# Patient Record
Sex: Female | Born: 1955
Health system: Southern US, Community
[De-identification: ages and names within clinical notes are randomized; demographics above are authoritative.]

## PROBLEM LIST (undated history)

## (undated) DIAGNOSIS — I639 Cerebral infarction, unspecified: Secondary | ICD-10-CM

## (undated) DIAGNOSIS — I10 Essential (primary) hypertension: Secondary | ICD-10-CM

## (undated) HISTORY — DX: Essential (primary) hypertension: I10

## (undated) HISTORY — DX: Cerebral infarction, unspecified: I63.9

---

## 2020-06-29 DIAGNOSIS — Z0189 Encounter for other specified special examinations: Secondary | ICD-10-CM | POA: Diagnosis not present

## 2020-07-02 DIAGNOSIS — I10 Essential (primary) hypertension: Secondary | ICD-10-CM | POA: Diagnosis not present

## 2020-07-02 DIAGNOSIS — Z043 Encounter for examination and observation following other accident: Secondary | ICD-10-CM | POA: Diagnosis not present

## 2020-07-02 DIAGNOSIS — Z713 Dietary counseling and surveillance: Secondary | ICD-10-CM | POA: Diagnosis not present

## 2020-07-09 ENCOUNTER — Other Ambulatory Visit: Payer: Self-pay

## 2020-07-09 ENCOUNTER — Ambulatory Visit
Admission: RE | Admit: 2020-07-09 | Discharge: 2020-07-09 | Disposition: A | Discharge status: Home / Self Care | Payer: BC Managed Care – PPO | Source: Ambulatory Visit | Attending: Sports Medicine | Admitting: Sports Medicine

## 2020-07-09 VITALS — BP 125/80 | HR 62 | Temp 98.0°F | Resp 18 | Ht 65.0 in | Wt 210.0 lb

## 2020-07-09 DIAGNOSIS — R350 Frequency of micturition: Secondary | ICD-10-CM | POA: Diagnosis not present

## 2020-07-09 DIAGNOSIS — N898 Other specified noninflammatory disorders of vagina: Secondary | ICD-10-CM | POA: Diagnosis not present

## 2020-07-09 LAB — URINALYSIS, COMPLETE (UACMP) WITH MICROSCOPIC
Bilirubin Urine: NEGATIVE
Glucose, UA: NEGATIVE mg/dL
Ketones, ur: NEGATIVE mg/dL
Nitrite: NEGATIVE
Protein, ur: NEGATIVE mg/dL
Specific Gravity, Urine: 1.025 (ref 1.005–1.030)
pH: 5.5 (ref 5.0–8.0)

## 2020-07-09 LAB — WET PREP, GENITAL
Clue Cells Wet Prep HPF POC: NONE SEEN
Sperm: NONE SEEN
Trich, Wet Prep: NONE SEEN
Yeast Wet Prep HPF POC: NONE SEEN

## 2020-07-09 MED ORDER — SULFAMETHOXAZOLE-TRIMETHOPRIM 800-160 MG PO TABS: 1.0000 | ORAL_TABLET | Freq: Two times a day (BID) | ORAL | 0 refills | Status: AC

## 2020-07-09 NOTE — ED Triage Notes (Signed)
Pt c/o urinary frequency and retention. She also states she has a discharge but it is clear and does not smell. She states she is unsure where the discharge is coming from

## 2020-07-09 NOTE — ED Provider Notes (Signed)
[facility]    CSN: [id number] Arrival date & time: 07/09/20  1839      History   Chief Complaint Chief Complaint  Patient presents with  . Appointment  . Urinary Frequency    HPI Karla Powell is a 64 y.o. female presenting for urinary frequency and urinary urgency over the past 10 days. Patient also states that she has had clear nonmalodorous vaginal discharge or "film" in her underwear.  She denies any dysuria, hematuria, abdominal pain/pelvic pain/back pain, vomiting, vaginal itching or lesions.  Denies any concern for STIs.  Has not tried any over-the-counter medications.  Denies any other complaints or concerns at this time.  HPI  History reviewed. No pertinent past medical history.  There are no problems to display for this patient.   Past Surgical History:  Procedure Laterality Date  . CESAREAN SECTION      OB History   No obstetric history on file.      Home Medications    Prior to Admission medications   Medication Sig Start Date End Date Taking? Authorizing Provider  amLODipine (NORVASC) 2.5 MG tablet Take 2.5 mg by mouth daily.   Yes [provider]  aspirin EC 81 MG tablet Take 81 mg by mouth daily. Swallow whole.   Yes [provider]  meloxicam (MOBIC) 15 MG tablet Take 15 mg by mouth daily.   Yes [provider]  sulfamethoxazole-trimethoprim (BACTRIM DS) 800-160 MG tablet Take 1 tablet by mouth 2 (two) times daily for 5 days. 07/09/20 07/14/20  Shirlee Latch, PA-C    Family History No family history on file.  Social History Social History   Tobacco Use  . Smoking status: Never Smoker  . Smokeless tobacco: Never Used  Vaping Use  . Vaping Use: Never used  Substance Use Topics  . Alcohol use: Not Currently  . Drug use: Not Currently     Allergies   Patient has no known allergies.   Review of Systems Review of Systems  Constitutional: Negative for chills and fever.  Gastrointestinal:  Negative for abdominal pain, diarrhea, nausea and vomiting.  Genitourinary: Positive for decreased urine volume, frequency, urgency and vaginal discharge. Negative for dysuria, flank pain, hematuria, pelvic pain, vaginal bleeding and vaginal pain.  Musculoskeletal: Negative for back pain.  Skin: Negative for rash.     Physical Exam Triage Vital Signs ED Triage Vitals  Enc Vitals Group     BP 07/09/20 1921 125/80     Pulse Rate 07/09/20 1921 62     Resp 07/09/20 1921 18     Temp 07/09/20 1921 98 F (36.7 C)     Temp Source 07/09/20 1921 Oral     SpO2 07/09/20 1921 100 %     Weight 07/09/20 1918 210 lb (95.3 kg)     Height 07/09/20 1918 5\' 5"  (1.651 m)     Head Circumference --      Peak Flow --      Pain Score 07/09/20 1918 0     Pain Loc --      Pain Edu? --      Excl. in GC? --    No data found.  Updated Vital Signs BP 125/80 (BP Location: Left Arm)   Pulse 62   Temp 98 F (36.7 C) (Oral)   Resp 18   Ht 5\' 5"  (1.651 m)   Wt 210 lb (95.3 kg)   SpO2 100%   BMI 34.95 kg/m  Physical Exam Vitals and nursing note reviewed.  Constitutional:      General: She is not in acute distress.    Appearance: Normal appearance. She is not ill-appearing or toxic-appearing.  HENT:     Head: Normocephalic and atraumatic.     Nose: Nose normal.     Mouth/Throat:     Mouth: Mucous membranes are moist.     Pharynx: Oropharynx is clear.  Eyes:     General: No scleral icterus.       Right eye: No discharge.        Left eye: No discharge.     Conjunctiva/sclera: Conjunctivae normal.  Cardiovascular:     Rate and Rhythm: Normal rate and regular rhythm.     Heart sounds: Normal heart sounds.  Pulmonary:     Effort: Pulmonary effort is normal. No respiratory distress.     Breath sounds: Normal breath sounds.  Abdominal:     Palpations: Abdomen is soft.     Tenderness: There is no abdominal tenderness. There is no right CVA tenderness or left CVA tenderness.   Musculoskeletal:     Cervical back: Neck supple.  Skin:    General: Skin is dry.  Neurological:     General: No focal deficit present.     Mental Status: She is alert. Mental status is at baseline.     Motor: No weakness.     Gait: Gait normal.  Psychiatric:        Mood and Affect: Mood normal.        Behavior: Behavior normal.        Thought Content: Thought content normal.      UC Treatments / Results  Labs (all labs ordered are listed, but only abnormal results are displayed) Labs Reviewed  WET PREP, GENITAL - Abnormal; Notable for the following components:      Result Value   WBC, Wet Prep HPF POC MANY (*)    All other components within normal limits  URINALYSIS, COMPLETE (UACMP) WITH MICROSCOPIC - Abnormal; Notable for the following components:   Hgb urine dipstick TRACE (*)    Leukocytes,Ua SMALL (*)    Bacteria, UA FEW (*)    All other components within normal limits    EKG   Radiology No results found.  Procedures Procedures (including critical care time)  Medications Ordered in UC Medications - No data to display  Initial Impression / Assessment and Plan / UC Course  I have reviewed the triage vital signs and the nursing notes.  Pertinent labs & imaging results that were available during my care of the patient were reviewed by me and considered in my medical decision making (see chart for details).   Wet prep is within normal limits.  Urinalysis shows trace blood and small leukocytes, however this does not appear to be a clean-catch.  We will send urine for culture.  Will treat for possible UTI given symptoms.  Advised increased rest and fluids.  Treating with short course of Bactrim DS.  Advised to follow-up as needed.  ED precautions reviewed.   Final Clinical Impressions(s) / UC Diagnoses   Final diagnoses:  Urinary frequency  Vaginal discharge     Discharge Instructions     The urine test does show some small white blood cells and trace  blood in the urine.  The urine was not a clean-catch, however.  I cannot say with certainty that you have a UTI.  I will send the urine for culture and  we will call with results in a couple of days.  Since you are having some increased symptoms over the past couple of days, we will treat you for UTI at this time with Bactrim DS.  If the urine culture is negative, someone may call you and advise you that you do not need to complete the antibiotic course.  If urine culture is negative and you do not have a UTI, you will need to follow-up with PCP for further work-up of your urinary frequency and urgency.  You may also consider following up with gynecology or urologist.    ED Prescriptions    Medication Sig Dispense Auth. Provider   sulfamethoxazole-trimethoprim (BACTRIM DS) 800-160 MG tablet Take 1 tablet by mouth 2 (two) times daily for 5 days. 10 tablet Gareth Morgan     PDMP not reviewed this encounter.   Shirlee Latch, PA-C 07/11/20 1235

## 2020-07-09 NOTE — Discharge Instructions (Signed)
The urine test does show some small white blood cells and trace blood in the urine.  The urine was not a clean-catch, however.  I cannot say with certainty that you have a UTI.  I will send the urine for culture and we will call with results in a couple of days.  Since you are having some increased symptoms over the past couple of days, we will treat you for UTI at this time with Bactrim DS.  If the urine culture is negative, someone may call you and advise you that you do not need to complete the antibiotic course.  If urine culture is negative and you do not have a UTI, you will need to follow-up with PCP for further work-up of your urinary frequency and urgency.  You may also consider following up with gynecology or urologist.

## 2020-07-26 DIAGNOSIS — M545 Low back pain, unspecified: Secondary | ICD-10-CM | POA: Diagnosis not present

## 2020-07-26 DIAGNOSIS — R300 Dysuria: Secondary | ICD-10-CM | POA: Diagnosis not present

## 2020-07-26 DIAGNOSIS — Z01419 Encounter for gynecological examination (general) (routine) without abnormal findings: Secondary | ICD-10-CM | POA: Diagnosis not present

## 2020-07-26 DIAGNOSIS — Z1231 Encounter for screening mammogram for malignant neoplasm of breast: Secondary | ICD-10-CM | POA: Diagnosis not present

## 2020-07-26 DIAGNOSIS — R319 Hematuria, unspecified: Secondary | ICD-10-CM | POA: Diagnosis not present

## 2020-07-26 LAB — HM MAMMOGRAPHY

## 2020-07-26 LAB — HM PAP SMEAR: HM Pap smear: NORMAL

## 2020-08-13 DIAGNOSIS — R311 Benign essential microscopic hematuria: Secondary | ICD-10-CM | POA: Diagnosis not present

## 2020-08-13 DIAGNOSIS — N390 Urinary tract infection, site not specified: Secondary | ICD-10-CM | POA: Diagnosis not present

## 2020-08-13 DIAGNOSIS — R3129 Other microscopic hematuria: Secondary | ICD-10-CM | POA: Diagnosis not present

## 2020-08-13 DIAGNOSIS — R309 Painful micturition, unspecified: Secondary | ICD-10-CM | POA: Diagnosis not present

## 2020-08-27 DIAGNOSIS — H2513 Age-related nuclear cataract, bilateral: Secondary | ICD-10-CM | POA: Diagnosis not present

## 2020-08-27 DIAGNOSIS — H16223 Keratoconjunctivitis sicca, not specified as Sjogren's, bilateral: Secondary | ICD-10-CM | POA: Diagnosis not present

## 2020-08-30 ENCOUNTER — Encounter: Payer: Self-pay | Admitting: Family Medicine

## 2020-08-30 ENCOUNTER — Ambulatory Visit (INDEPENDENT_AMBULATORY_CARE_PROVIDER_SITE_OTHER): Payer: BC Managed Care – PPO | Admitting: Family Medicine

## 2020-08-30 ENCOUNTER — Other Ambulatory Visit: Payer: Self-pay | Admitting: Family Medicine

## 2020-08-30 ENCOUNTER — Other Ambulatory Visit: Payer: Self-pay

## 2020-08-30 VITALS — BP 102/62 | HR 76 | Ht 65.0 in | Wt 216.0 lb

## 2020-08-30 DIAGNOSIS — Z7689 Persons encountering health services in other specified circumstances: Secondary | ICD-10-CM

## 2020-08-30 DIAGNOSIS — R350 Frequency of micturition: Secondary | ICD-10-CM | POA: Diagnosis not present

## 2020-08-30 DIAGNOSIS — R030 Elevated blood-pressure reading, without diagnosis of hypertension: Secondary | ICD-10-CM | POA: Diagnosis not present

## 2020-08-30 DIAGNOSIS — B3731 Acute candidiasis of vulva and vagina: Secondary | ICD-10-CM

## 2020-08-30 DIAGNOSIS — N952 Postmenopausal atrophic vaginitis: Secondary | ICD-10-CM

## 2020-08-30 DIAGNOSIS — B373 Candidiasis of vulva and vagina: Secondary | ICD-10-CM

## 2020-08-30 LAB — POCT URINALYSIS DIPSTICK
Bilirubin, UA: NEGATIVE
Blood, UA: NEGATIVE
Glucose, UA: NEGATIVE
Ketones, UA: NEGATIVE
Nitrite, UA: NEGATIVE
Protein, UA: NEGATIVE
Spec Grav, UA: 1.015 (ref 1.010–1.025)
Urobilinogen, UA: 0.2 E.U./dL
pH, UA: 6 (ref 5.0–8.0)

## 2020-08-30 MED ORDER — FLUCONAZOLE 150 MG PO TABS: 150.0000 mg | ORAL_TABLET | Freq: Once | ORAL | 0 refills | Status: AC

## 2020-08-30 NOTE — Progress Notes (Signed)
Diflucan 150 mg once a day with the second dose in reserve if there is on resolve symptoms.   Date:  08/30/2020   Name:  Karla Powell   DOB:  1956/02/27   MRN:  [id number]   Chief Complaint: Establish Care (New to area and not having any) and Vaginitis (Itching after 2 rounds of antibiotics)  Patient is a 65 year old female who presents for a establish care exam. The patient reports the following problems: f/u uti/? Yeast infection. Health maintenance has been reviewed awaiting colaguard.  Female GU Problem The patient's primary symptoms include genital itching. The patient's pertinent negatives include no genital lesions, genital odor, genital rash, missed menses, pelvic pain, vaginal bleeding or vaginal discharge. Primary symptoms comment: uti rx x 2. This is a new problem. The current episode started 1 to 4 weeks ago. The problem occurs rarely. The problem has been gradually improving. Pertinent negatives include no abdominal pain, anorexia, back pain, chills, constipation, diarrhea, discolored urine, dysuria, fever, flank pain, frequency, headaches, hematuria, joint pain, joint swelling, nausea, painful intercourse, rash, sore throat, urgency or vomiting. Nothing aggravates the symptoms. She has tried nothing for the symptoms. The treatment provided moderate relief.    No results found for: CREATININE, BUN, NA, K, CL, CO2 No results found for: CHOL, HDL, LDLCALC, LDLDIRECT, TRIG, CHOLHDL No results found for: TSH No results found for: HGBA1C No results found for: WBC, HGB, HCT, MCV, PLT No results found for: ALT, AST, GGT, ALKPHOS, BILITOT   Review of Systems  Constitutional: Negative.  Negative for chills, fatigue, fever and unexpected weight change.  HENT: Negative for congestion, ear discharge, ear pain, rhinorrhea, sinus pressure, sneezing and sore throat.   Eyes: Negative for double vision, photophobia, pain, discharge, redness and itching.  Respiratory: Negative for cough,  shortness of breath, wheezing and stridor.   Gastrointestinal: Negative for abdominal pain, anorexia, blood in stool, constipation, diarrhea, nausea and vomiting.  Endocrine: Negative for cold intolerance, heat intolerance, polydipsia, polyphagia and polyuria.  Genitourinary: Negative for dysuria, flank pain, frequency, hematuria, menstrual problem, missed menses, pelvic pain, urgency, vaginal bleeding and vaginal discharge.  Musculoskeletal: Negative for arthralgias, back pain, joint pain and myalgias.  Skin: Negative for rash.  Allergic/Immunologic: Negative for environmental allergies and food allergies.  Neurological: Negative for dizziness, weakness, light-headedness, numbness and headaches.  Hematological: Negative for adenopathy. Does not bruise/bleed easily.  Psychiatric/Behavioral: Negative for dysphoric mood. The patient is not nervous/anxious.     There are no problems to display for this patient.   No Known Allergies  Past Surgical History:  Procedure Laterality Date  . CESAREAN SECTION      Social History   Tobacco Use  . Smoking status: Never Smoker  . Smokeless tobacco: Never Used  Vaping Use  . Vaping Use: Never used  Substance Use Topics  . Alcohol use: Not Currently  . Drug use: Never     Medication list has been reviewed and updated.  Current Meds  Medication Sig  . amLODipine (NORVASC) 2.5 MG tablet Take 2.5 mg by mouth daily.  Marland Kitchen aspirin EC 81 MG tablet Take 81 mg by mouth daily. Swallow whole.  . meloxicam (MOBIC) 15 MG tablet Take 15 mg by mouth daily.  . predniSONE (DELTASONE) 20 MG tablet prednisone 20 mg tablet  Take 40mg  PO daily x 4 days, then 20mg  PO daily x 4 days, then 10mg  PO daily x 4 days, then stop.    PHQ 2/9 Scores 08/30/2020  PHQ - 2  Score 1  PHQ- 9 Score 1    GAD 7 : Generalized Anxiety Score 08/30/2020  Nervous, Anxious, on Edge 2  Control/stop worrying 0  Worry too much - different things 0  Trouble relaxing 0  Restless 0   Easily annoyed or irritable 0  Afraid - awful might happen 0  Total GAD 7 Score 2  Anxiety Difficulty Not difficult at all    BP Readings from Last 3 Encounters:  08/30/20 102/62  07/09/20 125/80    Physical Exam Vitals and nursing note reviewed.  Constitutional:      Appearance: She is well-developed and well-nourished.  HENT:     Head: Normocephalic.     Right Ear: Tympanic membrane and external ear normal.     Left Ear: Tympanic membrane and external ear normal.     Mouth/Throat:     Mouth: Oropharynx is clear and moist.  Eyes:     General: Lids are everted, no foreign bodies appreciated. No scleral icterus.       Left eye: No foreign body or hordeolum.     Extraocular Movements: EOM normal.     Conjunctiva/sclera: Conjunctivae normal.     Right eye: Right conjunctiva is not injected.     Left eye: Left conjunctiva is not injected.     Pupils: Pupils are equal, round, and reactive to light.  Neck:     Thyroid: No thyromegaly.     Vascular: No JVD.     Trachea: No tracheal deviation.  Cardiovascular:     Rate and Rhythm: Normal rate and regular rhythm.     Pulses: Intact distal pulses.     Heart sounds: Normal heart sounds. No murmur heard. No friction rub. No gallop.   Pulmonary:     Effort: Pulmonary effort is normal. No respiratory distress.     Breath sounds: Normal breath sounds. No wheezing or rales.  Abdominal:     General: Bowel sounds are normal.     Palpations: Abdomen is soft. There is no hepatosplenomegaly or mass.     Tenderness: There is no abdominal tenderness. There is no guarding or rebound.  Musculoskeletal:        General: No tenderness or edema. Normal range of motion.     Cervical back: Normal range of motion and neck supple.  Lymphadenopathy:     Cervical: No cervical adenopathy.  Skin:    General: Skin is warm.     Findings: No rash.  Neurological:     Mental Status: She is alert and oriented to person, place, and time.     Cranial  Nerves: No cranial nerve deficit.     Deep Tendon Reflexes: Strength normal. Reflexes normal.  Psychiatric:        Mood and Affect: Mood and affect normal. Mood is not anxious or depressed.     Wt Readings from Last 3 Encounters:  08/30/20 216 lb (98 kg)  07/09/20 210 lb (95.3 kg)    BP 102/62   Pulse 76   Ht 5\' 5"  (1.651 m)   Wt 216 lb (98 kg)   BMI 35.94 kg/m   Assessment and Plan: 1. Establishing care with new doctor, encounter for Patient presents for establishment of care with new physician.  Review of patient's previous encounters as well as most recent labs and urgent care visits were reviewed.  2. Urinary frequency Patient had a history of urinary frequency for which she was evaluated and lab in urgent care and was treated for  urinary tract infection on 2 occasions.  We discussed the possibility of a overactive bladder or the possibility of recurrent/residual/persistent UTI.  Repeat of a dipstick urinalysis notes small leukocytes and urine was sent for culture.  We will hold on any antibiotics but will treat for below - POCT urinalysis dipstick - Urine Culture  3. Candida vaginitis Having taken 2 courses of antibiotics patient has symptoms of vaginitis.  This may account for the small amount of leukocytes and we will delineate this with culture.  We will - fluconazole (DIFLUCAN) 150 MG tablet; Take 1 tablet (150 mg total) by mouth once for 1 dose.  Dispense: 2 tablet; Refill: 0 - Urine Culture  4. Elevated blood pressure reading without diagnosis of hypertension She has a history of elevated blood pressure for which she is currently on amlodipine 2.5 mg.  Patient is asymptomatic and is tolerating well and we will encouraged to continuance of the amlodipine 2.5 mg daily.  5. Atrophic vaginitis And also has a history of atrophic vaginitis for which she is on Premarin gel.  Patient will sign a release of information for her most recent GYN evaluation in [place] for most  recent mammogram/Cologuard.

## 2020-09-01 LAB — URINE CULTURE: Organism ID, Bacteria: NO GROWTH

## 2020-09-05 ENCOUNTER — Other Ambulatory Visit: Payer: Self-pay

## 2020-09-05 DIAGNOSIS — R350 Frequency of micturition: Secondary | ICD-10-CM

## 2020-09-05 NOTE — Progress Notes (Unsigned)
Put in referral to urology per pt request

## 2020-10-15 ENCOUNTER — Other Ambulatory Visit: Payer: Self-pay

## 2020-10-15 ENCOUNTER — Ambulatory Visit (INDEPENDENT_AMBULATORY_CARE_PROVIDER_SITE_OTHER): Payer: BC Managed Care – PPO | Admitting: Urology

## 2020-10-15 VITALS — BP 121/80 | HR 67 | Ht 65.0 in | Wt 220.0 lb

## 2020-10-15 DIAGNOSIS — R350 Frequency of micturition: Secondary | ICD-10-CM | POA: Diagnosis not present

## 2020-10-15 NOTE — Progress Notes (Signed)
10/15/2020 2:30 PM   Karla Powell January 14, 1956 [id number]  Referring provider: Duanne Limerick, MD [address] [place],  [place] [address]  No chief complaint on file.   HPI: I was consulted to assess the patient's urinary frequency.  She had moved from [place] many months ago and all of a sudden was voiding every 5 to 10 minutes.  She describes a negative culture or it was lost in processing.  She failed an antibiotic.  She then described a second negative culture and failing antibiotic.  In the last 2 weeks things have really settled down and she is basely back to baseline  Now she voids every 1 hour and cannot hold it for 2 hours.  No nocturia.  She is continent.  She has had a stroke but no hysterectomy  She was having a gritty feeling in the vaginal area has normalized.  No history of bladder surgery kidney stones or infections     PMH: Past Medical History:  Diagnosis Date  . Hypertension   . Stroke [facility])     Surgical History: Past Surgical History:  Procedure Laterality Date  . CESAREAN SECTION      Home Medications:  Allergies as of 10/15/2020   No Known Allergies     Medication List       Accurate as of October 15, 2020  2:30 PM. If you have any questions, ask your nurse or doctor.        amLODipine 2.5 MG tablet Commonly known as: NORVASC Take 2.5 mg by mouth daily.   aspirin EC 81 MG tablet Take 81 mg by mouth daily. Swallow whole.   meloxicam 15 MG tablet Commonly known as: MOBIC Take 15 mg by mouth daily.   predniSONE 20 MG tablet Commonly known as: DELTASONE prednisone 20 mg tablet  Take 40mg  PO daily x 4 days, then 20mg  PO daily x 4 days, then 10mg  PO daily x 4 days, then stop.       Allergies: No Known Allergies  Family History: Family History  Problem Relation Age of Onset  . Heart disease Father   . Hypertension Father   . Stroke Maternal Grandmother   . Stroke Maternal Grandfather     Social History:   reports that she has never smoked. She has never used smokeless tobacco. She reports previous alcohol use. She reports that she does not use drugs.  ROS:                                        Physical Exam: There were no vitals taken for this visit.  Constitutional:  Alert and oriented, No acute distress. HEENT: [place] AT, moist mucus membranes.  Trachea midline, no masses. Cardiovascular: No clubbing, cyanosis, or edema. Respiratory: Normal respiratory effort, no increased work of breathing. GI: Abdomen is soft, nontender, nondistended, no abdominal masses GU: No CVA tenderness.  No prolapse or stress incontinence Skin: No rashes, bruises or suspicious lesions. Lymph: No cervical or inguinal adenopathy. Neurologic: Grossly intact, no focal deficits, moving all 4 extremities. Psychiatric: Normal mood and affect.  Laboratory Data: No results found for: WBC, HGB, HCT, MCV, PLT  No results found for: CREATININE  No results found for: PSA  No results found for: TESTOSTERONE  No results found for: HGBA1C  Urinalysis    Component Value Date/Time   COLORURINE YELLOW 07/09/2020 1922   APPEARANCEUR  CLEAR 07/09/2020 1922   LABSPEC 1.025 07/09/2020 1922   PHURINE 5.5 07/09/2020 1922   GLUCOSEU NEGATIVE 07/09/2020 1922   HGBUR TRACE (A) 07/09/2020 1922   BILIRUBINUR neg 08/30/2020 1515   KETONESUR NEGATIVE 07/09/2020 1922   PROTEINUR Negative 08/30/2020 1515   PROTEINUR NEGATIVE 07/09/2020 1922   UROBILINOGEN 0.2 08/30/2020 1515   NITRITE neg 08/30/2020 1515   NITRITE NEGATIVE 07/09/2020 1922   LEUKOCYTESUR Small (1+) (A) 08/30/2020 1515   LEUKOCYTESUR SMALL (A) 07/09/2020 1922    Pertinent Imaging: Urine reviewed.  Chart reviewed.  Urine sent for culture  Assessment & Plan: Patient had significant frequency undiagnosed.  She had a negative culture last month.  No blood in urine.  Urine sent for culture.  Call if positive.  Possible causes discussed.  I  think we are fine not to do cystoscopy.  Reassurance given.  See as needed  There are no diagnoses linked to this encounter.  No follow-ups on file.  Martina Sinner, MD  [facility] [address] [place], [place] [address] [phone]

## 2020-10-16 LAB — URINALYSIS, COMPLETE
Bilirubin, UA: NEGATIVE
Glucose, UA: NEGATIVE
Ketones, UA: NEGATIVE
Nitrite, UA: NEGATIVE
Protein,UA: NEGATIVE
RBC, UA: NEGATIVE
Specific Gravity, UA: >1.03 — ABNORMAL HIGH (ref 1.005–1.030)
Urobilinogen, Ur: 0.2 mg/dL (ref 0.2–1.0)
pH, UA: 5 (ref 5.0–7.5)

## 2020-10-16 LAB — MICROSCOPIC EXAMINATION

## 2020-10-19 LAB — CULTURE, URINE COMPREHENSIVE

## 2020-12-28 DIAGNOSIS — Z0189 Encounter for other specified special examinations: Secondary | ICD-10-CM | POA: Diagnosis not present

## 2021-02-25 DIAGNOSIS — Z713 Dietary counseling and surveillance: Secondary | ICD-10-CM | POA: Diagnosis not present

## 2021-02-25 DIAGNOSIS — Z043 Encounter for examination and observation following other accident: Secondary | ICD-10-CM | POA: Diagnosis not present

## 2021-02-25 DIAGNOSIS — E785 Hyperlipidemia, unspecified: Secondary | ICD-10-CM | POA: Diagnosis not present

## 2021-04-11 ENCOUNTER — Ambulatory Visit
Admission: RE | Admit: 2021-04-11 | Discharge: 2021-04-11 | Disposition: A | Discharge status: Home / Self Care | Payer: BC Managed Care – PPO | Source: Ambulatory Visit | Attending: Family Medicine | Admitting: Family Medicine

## 2021-04-11 ENCOUNTER — Other Ambulatory Visit: Payer: Self-pay

## 2021-04-11 ENCOUNTER — Ambulatory Visit
Admission: RE | Admit: 2021-04-11 | Discharge: 2021-04-11 | Disposition: A | Discharge status: Home / Self Care | Payer: BC Managed Care – PPO | Attending: Family Medicine | Admitting: Family Medicine

## 2021-04-11 ENCOUNTER — Telehealth: Payer: Self-pay

## 2021-04-11 ENCOUNTER — Encounter: Payer: Self-pay | Admitting: Family Medicine

## 2021-04-11 ENCOUNTER — Ambulatory Visit: Payer: BC Managed Care – PPO | Admitting: Family Medicine

## 2021-04-11 ENCOUNTER — Other Ambulatory Visit
Admission: RE | Admit: 2021-04-11 | Discharge: 2021-04-11 | Disposition: A | Discharge status: Home / Self Care | Payer: BC Managed Care – PPO | Source: Home / Self Care | Attending: Family Medicine | Admitting: Family Medicine

## 2021-04-11 VITALS — BP 112/68 | HR 64 | Ht 65.0 in | Wt 209.0 lb

## 2021-04-11 DIAGNOSIS — U099 Post covid-19 condition, unspecified: Secondary | ICD-10-CM

## 2021-04-11 DIAGNOSIS — U071 COVID-19: Secondary | ICD-10-CM | POA: Diagnosis not present

## 2021-04-11 DIAGNOSIS — B349 Viral infection, unspecified: Secondary | ICD-10-CM

## 2021-04-11 DIAGNOSIS — R059 Cough, unspecified: Secondary | ICD-10-CM | POA: Diagnosis not present

## 2021-04-11 LAB — CBC WITH DIFFERENTIAL/PLATELET
Abs Immature Granulocytes: 0.03 10*3/uL (ref 0.00–0.07)
Basophils Absolute: 0 10*3/uL (ref 0.0–0.1)
Basophils Relative: 1 %
Eosinophils Absolute: 0.1 10*3/uL (ref 0.0–0.5)
Eosinophils Relative: 2 %
HCT: 39.8 % (ref 36.0–46.0)
Hemoglobin: 13.2 g/dL (ref 12.0–15.0)
Immature Granulocytes: 1 %
Lymphocytes Relative: 18 %
Lymphs Abs: 1.1 10*3/uL (ref 0.7–4.0)
MCH: 29.9 pg (ref 26.0–34.0)
MCHC: 33.2 g/dL (ref 30.0–36.0)
MCV: 90.2 fL (ref 80.0–100.0)
Monocytes Absolute: 0.4 10*3/uL (ref 0.1–1.0)
Monocytes Relative: 7 %
Neutro Abs: 4.3 10*3/uL (ref 1.7–7.7)
Neutrophils Relative %: 71 %
Platelets: 240 10*3/uL (ref 150–400)
RBC: 4.41 MIL/uL (ref 3.87–5.11)
RDW: 12.8 % (ref 11.5–15.5)
WBC: 5.9 10*3/uL (ref 4.0–10.5)
nRBC: 0 % (ref 0.0–0.2)

## 2021-04-11 MED ORDER — AZITHROMYCIN 250 MG PO TABS: ORAL_TABLET | ORAL | 0 refills | Status: AC

## 2021-04-11 NOTE — Progress Notes (Signed)
Sent in ZPack 

## 2021-04-11 NOTE — Telephone Encounter (Signed)
Called pt and sent in ZPack and told her to call us on Monday or Tuesday if she starts to feel a yeast infection coming on.

## 2021-04-11 NOTE — Progress Notes (Signed)
Date:  04/11/2021   Name:  Karla Powell   DOB:  Jul 13, 1956   MRN:  [id number]   Chief Complaint: Covid Positive (Cough s/p covid)  Cough This is a new (covid positive 10 days) problem. The current episode started in the past 7 days. The problem has been gradually improving. The cough is Non-productive. Associated symptoms include shortness of breath. Pertinent negatives include no chest pain, chills, fever, myalgias, postnasal drip, rhinorrhea, sore throat, sweats or wheezing. The treatment provided mild relief.   No results found for: CREATININE, BUN, NA, K, CL, CO2 No results found for: CHOL, HDL, LDLCALC, LDLDIRECT, TRIG, CHOLHDL No results found for: TSH No results found for: HGBA1C No results found for: WBC, HGB, HCT, MCV, PLT No results found for: ALT, AST, GGT, ALKPHOS, BILITOT   Review of Systems  Constitutional:  Negative for chills and fever.  HENT:  Negative for postnasal drip, rhinorrhea and sore throat.   Respiratory:  Positive for cough and shortness of breath. Negative for apnea, choking, chest tightness, wheezing and stridor.   Cardiovascular:  Negative for chest pain.  Musculoskeletal:  Negative for myalgias.   There are no problems to display for this patient.   No Known Allergies  Past Surgical History:  Procedure Laterality Date   CESAREAN SECTION      Social History   Tobacco Use   Smoking status: Never   Smokeless tobacco: Never  Vaping Use   Vaping Use: Never used  Substance Use Topics   Alcohol use: Not Currently   Drug use: Never     Medication list has been reviewed and updated.  No outpatient medications have been marked as taking for the 04/11/21 encounter (Office Visit) with Duanne Limerick, MD.    [facility] 2/9 Scores 08/30/2020  PHQ - 2 Score 1  PHQ- 9 Score 1    GAD 7 : Generalized Anxiety Score 08/30/2020  Nervous, Anxious, on Edge 2  Control/stop worrying 0  Worry too much - different things 0  Trouble relaxing 0  Restless  0  Easily annoyed or irritable 0  Afraid - awful might happen 0  Total GAD 7 Score 2  Anxiety Difficulty Not difficult at all    BP Readings from Last 3 Encounters:  04/11/21 112/68  10/15/20 121/80  08/30/20 102/62    Physical Exam Vitals and nursing note reviewed.  Constitutional:      Appearance: She is well-developed.  HENT:     Head: Normocephalic.     Right Ear: Ear canal and external ear normal. Tympanic membrane is retracted.     Left Ear: Tympanic membrane, ear canal and external ear normal.     Nose:     Right Turbinates: Not swollen.     Left Turbinates: Not swollen.     Right Sinus: No maxillary sinus tenderness or frontal sinus tenderness.     Left Sinus: No maxillary sinus tenderness or frontal sinus tenderness.     Mouth/Throat:     Lips: Pink.     Mouth: Mucous membranes are moist.     Tongue: No lesions. Tongue does not deviate from midline.     Palate: No mass and lesions.     Pharynx: Oropharynx is clear. Uvula midline.  Eyes:     General: Lids are everted, no foreign bodies appreciated. No scleral icterus.       Left eye: No foreign body or hordeolum.     Conjunctiva/sclera: Conjunctivae normal.     Right  eye: Right conjunctiva is not injected.     Left eye: Left conjunctiva is not injected.     Pupils: Pupils are equal, round, and reactive to light.  Neck:     Thyroid: No thyromegaly.     Vascular: No JVD.     Trachea: No tracheal deviation.  Cardiovascular:     Rate and Rhythm: Normal rate and regular rhythm.     Heart sounds: Normal heart sounds. No murmur heard.   No friction rub. No gallop.  Pulmonary:     Effort: Pulmonary effort is normal. No respiratory distress.     Breath sounds: Normal breath sounds. No wheezing or rales.  Abdominal:     General: Bowel sounds are normal.     Palpations: Abdomen is soft. There is no mass.     Tenderness: There is no abdominal tenderness. There is no guarding or rebound.  Musculoskeletal:         General: No tenderness. Normal range of motion.     Cervical back: Normal range of motion and neck supple.  Lymphadenopathy:     Cervical: No cervical adenopathy.     Right cervical: No superficial, deep or posterior cervical adenopathy.    Left cervical: No superficial, deep or posterior cervical adenopathy.  Skin:    General: Skin is warm.     Findings: No rash.  Neurological:     Mental Status: She is alert and oriented to person, place, and time.     Cranial Nerves: No cranial nerve deficit.     Deep Tendon Reflexes: Reflexes normal.  Psychiatric:        Mood and Affect: Mood is not anxious or depressed.    Wt Readings from Last 3 Encounters:  04/11/21 209 lb (94.8 kg)  10/15/20 220 lb (99.8 kg)  08/30/20 216 lb (98 kg)    BP 112/68   Pulse 64   Ht 5\' 5"  (1.651 m)   Wt 209 lb (94.8 kg)   BMI 34.78 kg/m   Assessment and Plan:  1. Post-COVID-19 condition New onset.  COVID 11 days ago currently only having a cough which began 48 hours ago.  Given new onset cough we will do a chest x-ray and CBC and inform patient of results today.  COVID protocol has been discussed with patient. - DG Chest 2 View; Future

## 2021-04-11 NOTE — Telephone Encounter (Signed)
Copied from CRM [id number]. Topic: General - Other >> Apr 11, 2021 11:48 AM Karla Powell wrote: Reason for CRM: Pt requests call back to go over x-ray results. Cb# [phone]

## 2021-04-18 ENCOUNTER — Ambulatory Visit: Payer: Self-pay | Admitting: *Deleted

## 2021-04-18 NOTE — Telephone Encounter (Signed)
Reason for Disposition . [1] MODERATE pain (e.g., interferes with normal activities, limping) AND [2] present > 3 days  Answer Assessment - Initial Assessment Questions 1. ONSET: "When did the pain start?"      2 months ago, worsening 2. LOCATION: "Where is the pain located?"      Both feet, arches 3. PAIN: "How bad is the pain?"    (Scale 1-10; or mild, moderate, severe)  - MILD (1-3): doesn't interfere with normal activities.   - MODERATE (4-7): interferes with normal activities (e.g., work or school) or awakens from sleep, limping.   - SEVERE (8-10): excruciating pain, unable to do any normal activities, unable to walk.      Varies,  3-4/10 now, with walking distance, "Can't walk" 4. WORK OR EXERCISE: "Has there been any recent work or exercise that involved this part of the body?"      no 5. CAUSE: "What do you think is causing the foot pain?"     Unsure 6. OTHER SYMPTOMS: "Do you have any other symptoms?" (e.g., leg pain, rash, fever, numbness)     Rash on both legs at times, not presently  Protocols used: Foot Pain-A-AH

## 2021-04-18 NOTE — Telephone Encounter (Signed)
Pt reports foot pain, both feet x 2 months,. States made appt with Dr. Yetta Barre, seen 9.22.22 for issue "But was not addressed because I had covid and that's what we talked about." States did mention issue to Dr. Yetta Barre, "She said to quit my job so I did." States pain is in arches, left >right. 3/10 at rest "After walking short distance, 10/10, can't walk." Denies redness, no warmth, no swelling. States did have plantar fasciitis of right foot months ago "But this feels different." Pt states she is leaving for [place] tomorrow AM to see vascular MD "About my varicose veins."  Requesting Xray of feet "To make sure there are no stress fractures." Pt last tested for covid 04/09/21, positive. Assured pt NT would route to practice for PCPs review and final disposition.   Pt verbalizes understanding. CB# [phone]

## 2021-04-19 NOTE — Telephone Encounter (Signed)
Patient will be out of town and may call back to set up appointment

## 2021-05-01 DIAGNOSIS — R600 Localized edema: Secondary | ICD-10-CM | POA: Diagnosis not present

## 2021-05-13 DIAGNOSIS — M79605 Pain in left leg: Secondary | ICD-10-CM | POA: Diagnosis not present

## 2021-05-13 DIAGNOSIS — R600 Localized edema: Secondary | ICD-10-CM | POA: Diagnosis not present

## 2021-05-13 DIAGNOSIS — M79604 Pain in right leg: Secondary | ICD-10-CM | POA: Diagnosis not present

## 2021-05-14 DIAGNOSIS — I83893 Varicose veins of bilateral lower extremities with other complications: Secondary | ICD-10-CM | POA: Diagnosis not present

## 2021-05-29 DIAGNOSIS — I83892 Varicose veins of left lower extremities with other complications: Secondary | ICD-10-CM | POA: Diagnosis not present

## 2021-05-31 DIAGNOSIS — R300 Dysuria: Secondary | ICD-10-CM | POA: Diagnosis not present

## 2021-05-31 DIAGNOSIS — K625 Hemorrhage of anus and rectum: Secondary | ICD-10-CM | POA: Diagnosis not present

## 2021-06-12 DIAGNOSIS — I872 Venous insufficiency (chronic) (peripheral): Secondary | ICD-10-CM | POA: Diagnosis not present

## 2021-06-12 DIAGNOSIS — R350 Frequency of micturition: Secondary | ICD-10-CM | POA: Diagnosis not present

## 2021-06-12 DIAGNOSIS — I83893 Varicose veins of bilateral lower extremities with other complications: Secondary | ICD-10-CM | POA: Diagnosis not present

## 2021-06-12 DIAGNOSIS — R300 Dysuria: Secondary | ICD-10-CM | POA: Diagnosis not present

## 2021-06-12 DIAGNOSIS — N362 Urethral caruncle: Secondary | ICD-10-CM | POA: Diagnosis not present

## 2021-07-03 DIAGNOSIS — R3915 Urgency of urination: Secondary | ICD-10-CM | POA: Diagnosis not present

## 2021-07-03 DIAGNOSIS — R350 Frequency of micturition: Secondary | ICD-10-CM | POA: Diagnosis not present

## 2021-07-03 DIAGNOSIS — N362 Urethral caruncle: Secondary | ICD-10-CM | POA: Diagnosis not present

## 2021-07-04 DIAGNOSIS — D123 Benign neoplasm of transverse colon: Secondary | ICD-10-CM | POA: Diagnosis not present

## 2021-07-04 DIAGNOSIS — K635 Polyp of colon: Secondary | ICD-10-CM | POA: Diagnosis not present

## 2021-07-04 DIAGNOSIS — Z1211 Encounter for screening for malignant neoplasm of colon: Secondary | ICD-10-CM | POA: Diagnosis not present

## 2021-07-04 DIAGNOSIS — K644 Residual hemorrhoidal skin tags: Secondary | ICD-10-CM | POA: Diagnosis not present

## 2021-07-04 LAB — HM COLONOSCOPY

## 2021-08-13 DIAGNOSIS — M064 Inflammatory polyarthropathy: Secondary | ICD-10-CM | POA: Diagnosis not present

## 2021-08-13 DIAGNOSIS — M79642 Pain in left hand: Secondary | ICD-10-CM | POA: Diagnosis not present

## 2021-08-13 DIAGNOSIS — Z01419 Encounter for gynecological examination (general) (routine) without abnormal findings: Secondary | ICD-10-CM | POA: Diagnosis not present

## 2021-08-13 DIAGNOSIS — M79641 Pain in right hand: Secondary | ICD-10-CM | POA: Diagnosis not present

## 2021-08-13 DIAGNOSIS — M19041 Primary osteoarthritis, right hand: Secondary | ICD-10-CM | POA: Diagnosis not present

## 2021-08-13 DIAGNOSIS — Z1231 Encounter for screening mammogram for malignant neoplasm of breast: Secondary | ICD-10-CM | POA: Diagnosis not present

## 2021-08-13 DIAGNOSIS — D2111 Benign neoplasm of connective and other soft tissue of right upper limb, including shoulder: Secondary | ICD-10-CM | POA: Diagnosis not present

## 2021-08-13 DIAGNOSIS — R87610 Atypical squamous cells of undetermined significance on cytologic smear of cervix (ASC-US): Secondary | ICD-10-CM | POA: Diagnosis not present

## 2021-12-26 DIAGNOSIS — Z0189 Encounter for other specified special examinations: Secondary | ICD-10-CM | POA: Diagnosis not present

## 2021-12-30 DIAGNOSIS — Z043 Encounter for examination and observation following other accident: Secondary | ICD-10-CM | POA: Diagnosis not present

## 2021-12-30 DIAGNOSIS — I10 Essential (primary) hypertension: Secondary | ICD-10-CM | POA: Diagnosis not present

## 2021-12-30 DIAGNOSIS — Z713 Dietary counseling and surveillance: Secondary | ICD-10-CM | POA: Diagnosis not present

## 2022-02-01 ENCOUNTER — Ambulatory Visit
Admission: RE | Admit: 2022-02-01 | Discharge: 2022-02-01 | Disposition: A | Discharge status: Home / Self Care | Payer: BC Managed Care – PPO | Source: Ambulatory Visit | Attending: Emergency Medicine | Admitting: Emergency Medicine

## 2022-02-01 VITALS — BP 120/71 | HR 66 | Temp 97.7°F | Resp 14 | Ht 65.0 in | Wt 215.0 lb

## 2022-02-01 DIAGNOSIS — R350 Frequency of micturition: Secondary | ICD-10-CM

## 2022-02-01 DIAGNOSIS — I10 Essential (primary) hypertension: Secondary | ICD-10-CM | POA: Diagnosis not present

## 2022-02-01 DIAGNOSIS — R600 Localized edema: Secondary | ICD-10-CM

## 2022-02-01 LAB — URINALYSIS, ROUTINE W REFLEX MICROSCOPIC
Bilirubin Urine: NEGATIVE
Glucose, UA: NEGATIVE mg/dL
Hgb urine dipstick: NEGATIVE
Ketones, ur: NEGATIVE mg/dL
Nitrite: NEGATIVE
Protein, ur: NEGATIVE mg/dL
Specific Gravity, Urine: 1.02 (ref 1.005–1.030)
pH: 5.5 (ref 5.0–8.0)

## 2022-02-01 LAB — URINALYSIS, MICROSCOPIC (REFLEX): RBC / HPF: NONE SEEN RBC/hpf (ref 0–5)

## 2022-02-01 NOTE — Discharge Instructions (Signed)
You were seen for blood pressure concerns and increased urination and are being treated for high blood pressure and urinary frequency.   -Stop taking her Mobic.  Attempt to treat pain with high-dose acetaminophen as needed. -Wear compression stockings during the day. -We are sending your urine out for a culture. If we need to add or change any medications, our nurse will give you a call to let you know. -Follow-up with your primary care provider to address the above concerns further.  Take care, Dr. Sharlet Salina, NP-c

## 2022-02-01 NOTE — ED Provider Notes (Signed)
[facility] - [place], [place]   Name: Karla Powell DOB: [birth year] MRN: [id number] CSN: [id number] PCP: Duanne Limerick, MD  Arrival date and time:  02/01/22 0947  Chief Complaint:  Urinary Frequency, Hypertension, and Leg Swelling   NOTE: Prior to seeing the patient today, I have reviewed the triage nursing documentation and vital signs. Clinical staff has updated patient's PMH/PSHx, current medication list, and drug allergies/intolerances to ensure comprehensive history available to assist in medical decision making.   History:   HPI: Karla Powell is a 66 y.o. female who presents today with complaints of elevated blood pressure and urinary frequency. Patient states her blood pressures at home have been elevated at 150s/110s.  While these are not consistent blood pressure readings, they are frequent enough to cause some concern.  She is also noted some increased shortness of breath and leg swelling.  She has mild orthopneic symptoms, stating it is difficult for her to fall asleep while lying flat.  States the symptoms are similar to when she was first started on blood pressure medication 4 years ago.  Full cardiac work-up was completed at that time including a diagnostic left heart cath.  She denies chest pain/pressure, dizziness.  She has been taking Mobic daily for bilateral lower extremity pain.  She has stopped 2 days ago when she noticed increased welling to her lower extremities.  She is also complaining of increased frequency for 2 weeks.  She denies pain with urination or any additional symptoms; just increased volume with urination.  She states she had similar symptoms in the past and was started on Premarin by her gynecologist and symptoms resolved shortly afterwards.  She has no active diabetes diagnosis.  She had noticed that her increased urinary frequency started around the same time she noticed increased blood pressure readings at home.   Past Medical History:   Diagnosis Date   Hypertension    Stroke [facility])     Past Surgical History:  Procedure Laterality Date   CESAREAN SECTION      Family History  Problem Relation Age of Onset   Heart disease Father    Hypertension Father    Stroke Maternal Grandmother    Stroke Maternal Grandfather     Social History   Tobacco Use   Smoking status: Never   Smokeless tobacco: Never  Vaping Use   Vaping Use: Never used  Substance Use Topics   Alcohol use: Not Currently   Drug use: Never    There are no problems to display for this patient.   Home Medications:    Current Meds  Medication Sig   amLODipine (NORVASC) 2.5 MG tablet Take 2.5 mg by mouth daily.   aspirin EC 81 MG tablet Take 81 mg by mouth daily. Swallow whole.   conjugated estrogens (PREMARIN) vaginal cream Place 1 Applicatorful vaginally daily.    Allergies:   Patient has no known allergies.  Review of Systems (ROS): Review of Systems  Constitutional:  Negative for activity change, appetite change, chills, fatigue and fever.  Respiratory:  Negative for cough and shortness of breath.   Cardiovascular:  Positive for leg swelling. Negative for chest pain and palpitations.  Genitourinary:  Positive for frequency. Negative for decreased urine volume, dysuria, flank pain and urgency.     Vital Signs: Today's Vitals   02/01/22 0956 02/01/22 0959  BP:  120/71  Pulse:  66  Resp:  14  Temp:  97.7 F (36.5 C)  TempSrc:  Oral  SpO2:  99%  Weight: 215 lb (97.5 kg)   Height: 5\' 5"  (1.651 m)   PainSc: 0-No pain     Physical Exam: Physical Exam Vitals and nursing note reviewed.  Constitutional:      Appearance: Normal appearance.  Cardiovascular:     Rate and Rhythm: Normal rate and regular rhythm.     Pulses: Normal pulses.          Dorsalis pedis pulses are 2+ on the right side and 2+ on the left side.     Heart sounds: Normal heart sounds. No murmur heard. Pulmonary:     Effort: No tachypnea.     Breath  sounds: Normal breath sounds.  Musculoskeletal:     Right lower leg: 1+ Pitting Edema present.     Left lower leg: 1+ Pitting Edema present.  Skin:    General: Skin is warm and dry.  Neurological:     Mental Status: She is alert.  Psychiatric:        Mood and Affect: Mood normal.        Behavior: Behavior normal.        Thought Content: Thought content normal.      Urgent Care Treatments / Results:   LABS: PLEASE NOTE: all labs that were ordered this encounter are listed, however only abnormal results are displayed. Labs Reviewed  URINALYSIS, ROUTINE W REFLEX MICROSCOPIC - Abnormal; Notable for the following components:      Result Value   Leukocytes,Ua SMALL (*)    All other components within normal limits  URINALYSIS, MICROSCOPIC (REFLEX) - Abnormal; Notable for the following components:   Bacteria, UA FEW (*)    Non Squamous Epithelial PRESENT (*)    All other components within normal limits  URINE CULTURE    EKG: -Normal sinus rhythm  RADIOLOGY: No results found.  PROCEDURES: Procedures  MEDICATIONS RECEIVED THIS VISIT: Medications - No data to display  PERTINENT CLINICAL COURSE NOTES/UPDATES:   Initial Impression / Assessment and Plan / Urgent Care Course:  Pertinent labs & imaging results that were available during my care of the patient were personally reviewed by me and considered in my medical decision making (see lab/imaging section of note for values and interpretations).  Karla Powell is a 66 y.o. female who presents to [facility] today with complaints of elevated blood pressure readings, leg swelling and urinary frequency, diagnosed with the same, and treated as such with the directions below. NP and patient reviewed discharge instructions below during visit.  I strongly suggested that she followed up with a primary care provider for further evaluation to rule out worsening cardiac function or diabetes.  Patient verbalized understanding.   Culture to be sent to rule out lingering bacterial infection.  Patient is well appearing overall in clinic today. She does not appear to be in any acute distress. Presenting symptoms (see HPI) and exam as documented above.   I have reviewed the follow up and strict return precautions for any new or worsening symptoms. Patient is aware of symptoms that would be deemed urgent/emergent, and would thus require further evaluation either here or in the emergency department. At the time of discharge, she verbalized understanding and consent with the discharge plan as it was reviewed with her. All questions were fielded by provider and/or clinic staff prior to patient discharge.    Final Clinical Impressions / Urgent Care Diagnoses:   Final diagnoses:  Primary hypertension  Lower extremity edema  Urinary frequency  New Prescriptions:  [place] Controlled Substance Registry consulted? Not Applicable  No orders of the defined types were placed in this encounter.     Discharge Instructions      You were seen for blood pressure concerns and increased urination and are being treated for high blood pressure and urinary frequency.   -Stop taking her Mobic.  Attempt to treat pain with high-dose acetaminophen as needed. -Wear compression stockings during the day. -We are sending your urine out for a culture. If we need to add or change any medications, our nurse will give you a call to let you know. -Follow-up with your primary care provider to address the above concerns further.  Take care, Dr. Sharlet Salina, NP-c     Recommended Follow up Care:  Patient encouraged to follow up with the following provider within the specified time frame, or sooner as dictated by the severity of her symptoms. As always, she was instructed that for any urgent/emergent care needs, she should seek care either here or in the emergency department for more immediate evaluation.   Bailey Mech, DNP, NP-c   Bailey Mech, NP 02/01/22 1048

## 2022-02-01 NOTE — ED Triage Notes (Signed)
Patient c/o elevated blood pressure and lower leg and feet swelling for the past 2 weeks.  Patient also reports increase in urinary frequency.  Patient denies any pain or discomfort.

## 2022-02-03 LAB — URINE CULTURE: Culture: NO GROWTH

## 2022-03-25 ENCOUNTER — Telehealth: Payer: Self-pay | Admitting: Family Medicine

## 2022-03-25 ENCOUNTER — Telehealth: Payer: Self-pay

## 2022-03-25 NOTE — Telephone Encounter (Signed)
Patient called in with conerns about her visit at the Urgent Care. Patient was told to schedule an appt to have her Doctor do an EKG. Or a referral for EKG. Patient has concerns about what UC seen while she was there. Patient wanted to come into office but there is nothing available for today. I informed Patient that someone would call her back.

## 2022-03-25 NOTE — Telephone Encounter (Signed)
I returned pt's call concerning getting an appt today with [place]. I explained that our schedule is full due to it being the day after a holiday. I offered her an appt for tomorrow and she stated that she works in [place] and her only day to come in is today. I apologized that we cannot see her, but did look at the EKG that she was concerned about from July of this year. I informed her that Dr Park Breed signed off on it, therefore I am "assuming, but I was not there, that he felt it was okay." I explained that it is possible that the urgent care/ ER doctor consulted with cardiology to see what they thought about the results. She asked why she was not contacted since the notes were sent to [place]. I tried to explain that unless the UC or ER is concerned that there is a problem, typically we would not follow up. BUT if needed, we would be glad to. After reviewing the note, I saw where they wanted her to follow up with PCP for further work-up. To my knowledge pt has not called until today. Patient said she has a very tight schedule and asked what was the latest we could see her. I gave her a 340 appt slot for Friday, she declined. I continued to look and saw that we have a 320 slot for the 18th of Sept. She took that, but stated she might be looking for another doctor in the meantime because we were not willing to help her. I once again explained I was trying to help her, but it is the day after a holiday so we are booked. I even explained that Dr Ashley Royalty is seeing 2 of our pts because we have no room. I spent 11.5 minutes on the phone with the patient trying to help her. I did put her down for Sept 18th at 3:20. She asked what she would need to do to get her records from [place] for [place]. I explained she would need to sign a release of info with that doctor and we can send her records.

## 2022-03-25 NOTE — Telephone Encounter (Signed)
Copied from CRM [id number]. Topic: General - Other >> Mar 25, 2022 10:00 AM Melynda Keller J wrote: Reason for CRM: Pt called to come in today to discuss next step and being advised by UC to get an EKG order from PCP/ advised pt that Delice Bison would take a look at chart and call pt back / please advise

## 2022-04-07 ENCOUNTER — Ambulatory Visit (INDEPENDENT_AMBULATORY_CARE_PROVIDER_SITE_OTHER): Payer: BC Managed Care – PPO | Admitting: Family Medicine

## 2022-04-07 ENCOUNTER — Encounter: Payer: Self-pay | Admitting: Family Medicine

## 2022-04-07 VITALS — BP 118/82 | HR 75 | Ht 65.0 in | Wt 230.0 lb

## 2022-04-07 DIAGNOSIS — R9431 Abnormal electrocardiogram [ECG] [EKG]: Secondary | ICD-10-CM | POA: Diagnosis not present

## 2022-04-07 DIAGNOSIS — M20091 Other deformity of right finger(s): Secondary | ICD-10-CM

## 2022-04-07 DIAGNOSIS — M20092 Other deformity of left finger(s): Secondary | ICD-10-CM

## 2022-04-07 DIAGNOSIS — M7742 Metatarsalgia, left foot: Secondary | ICD-10-CM

## 2022-04-07 DIAGNOSIS — M7741 Metatarsalgia, right foot: Secondary | ICD-10-CM

## 2022-04-07 NOTE — Progress Notes (Signed)
Date:  04/07/2022   Name:  Karla Powell   DOB:  1956-05-23   MRN:  [id number]   Chief Complaint: Foot Pain (Wants to go back on meloxicam- did it make bp go up?) and Medication Consultation (To discuss EKG)  Foot Pain This is a chronic problem. The current episode started more than 1 month ago (9-12 months). The problem has been gradually improving. Pertinent negatives include no abdominal pain, chest pain or coughing. Associated symptoms comments: Orthopnea/ PND on NSAID.  Hypertension Pertinent negatives include no chest pain, orthopnea, palpitations, peripheral edema, PND or shortness of breath. Agents associated with hypertension include NSAIDs. Past treatments include calcium channel blockers (amlodipine). The current treatment provides mild improvement. There are no compliance problems.  There is no history of angina, kidney disease, CAD/MI, CVA, heart failure, left ventricular hypertrophy, PVD or retinopathy. There is no history of chronic renal disease, a hypertension causing med or renovascular disease.    No results found for: "NA", "K", "CO2", "GLUCOSE", "BUN", "CREATININE", "CALCIUM", "EGFR", "GFRNONAA" No results found for: "CHOL", "HDL", "LDLCALC", "LDLDIRECT", "TRIG", "CHOLHDL" No results found for: "TSH" No results found for: "HGBA1C" Lab Results  Component Value Date   WBC 5.9 04/11/2021   HGB 13.2 04/11/2021   HCT 39.8 04/11/2021   MCV 90.2 04/11/2021   PLT 240 04/11/2021   No results found for: "ALT", "AST", "GGT", "ALKPHOS", "BILITOT" No results found for: "25OHVITD2", "25OHVITD3", "VD25OH"   Review of Systems  Respiratory:  Negative for cough, chest tightness, shortness of breath and wheezing.   Cardiovascular:  Positive for leg swelling. Negative for chest pain, palpitations, orthopnea and PND.  Gastrointestinal:  Negative for abdominal pain and blood in stool.    There are no problems to display for this patient.   No Known Allergies  Past Surgical  History:  Procedure Laterality Date   CESAREAN SECTION      Social History   Tobacco Use   Smoking status: Never   Smokeless tobacco: Never  Vaping Use   Vaping Use: Never used  Substance Use Topics   Alcohol use: Not Currently   Drug use: Never     Medication list has been reviewed and updated.  Current Meds  Medication Sig   amLODipine (NORVASC) 2.5 MG tablet Take 2.5 mg by mouth daily.   aspirin EC 81 MG tablet Take 81 mg by mouth daily. Swallow whole.   conjugated estrogens (PREMARIN) vaginal cream Place 1 Applicatorful vaginally daily.       04/07/2022    3:50 PM 08/30/2020    2:39 PM  GAD 7 : Generalized Anxiety Score  Nervous, Anxious, on Edge 0 2  Control/stop worrying 0 0  Worry too much - different things 0 0  Trouble relaxing 0 0  Restless 0 0  Easily annoyed or irritable 0 0  Afraid - awful might happen 0 0  Total GAD 7 Score 0 2  Anxiety Difficulty Not difficult at all Not difficult at all       04/07/2022    3:49 PM 08/30/2020    2:38 PM  Depression screen PHQ 2/9  Decreased Interest 0 1  Down, Depressed, Hopeless 0 0  PHQ - 2 Score 0 1  Altered sleeping 0 0  Tired, decreased energy 0 0  Change in appetite 0 0  Feeling bad or failure about yourself  0 0  Trouble concentrating 0 0  Moving slowly or fidgety/restless 0 0  Suicidal thoughts 0 0  PHQ-9 Score  0 1  Difficult doing work/chores Not difficult at all Not difficult at all    BP Readings from Last 3 Encounters:  04/07/22 118/82  02/01/22 120/71  04/11/21 112/68    Physical Exam Vitals and nursing note reviewed. Exam conducted with a chaperone present.  Constitutional:      General: She is not in acute distress.    Appearance: She is not diaphoretic.  HENT:     Head: Normocephalic and atraumatic.     Right Ear: Tympanic membrane and external ear normal.     Left Ear: External ear normal.  Neck:     Thyroid: No thyromegaly.     Vascular: No carotid bruit or JVD.   Cardiovascular:     Rate and Rhythm: Normal rate and regular rhythm.     Pulses: Normal pulses.          Dorsalis pedis pulses are 2+ on the right side and 2+ on the left side.       Posterior tibial pulses are 2+ on the right side and 2+ on the left side.     Heart sounds: Normal heart sounds, S1 normal and S2 normal. No murmur heard.    No systolic murmur is present.     No diastolic murmur is present.     No friction rub. No gallop. No S3 or S4 sounds.  Pulmonary:     Effort: Pulmonary effort is normal.     Breath sounds: Normal breath sounds. No wheezing, rhonchi or rales.  Abdominal:     General: Bowel sounds are normal.     Palpations: Abdomen is soft. There is no hepatomegaly or splenomegaly.     Tenderness: There is no abdominal tenderness.  Musculoskeletal:        General: Deformity present.     Right hand: Deformity present.     Left hand: Deformity present.     Cervical back: Neck supple.     Right lower leg: 1+ Pitting Edema present.     Left lower leg: 1+ Pitting Edema present.     Comments: Bilateral ulna deviation bilateral/DIP  Lymphadenopathy:     Cervical: No cervical adenopathy.  Skin:    General: Skin is warm and dry.  Neurological:     Mental Status: She is alert.     Deep Tendon Reflexes: Reflexes are normal and symmetric.     Wt Readings from Last 3 Encounters:  04/07/22 230 lb (104.3 kg)  02/01/22 215 lb (97.5 kg)  04/11/21 209 lb (94.8 kg)    BP 118/82   Pulse 75   Ht $R'5\' 5"'Xn$  (1.651 m)   Wt 230 lb (104.3 kg)   SpO2 94%   BMI 38.27 kg/m   Assessment and Plan: 1. Metatarsalgia of both feet New onset.  Persistent.  Stable but bothersome. Patient has had overall foot pain over the body of both feet more so in the mornings but not necessarily involving the plantar fascia only.  There is mild tenderness and squeezing the foot.  There is full range of motion.  We will refer to podiatry for evaluation.- Ambulatory referral to Podiatry  2. Abnormal  EKG Patient at urgent care visit had an EKG done which showed the possibility of LAE.  On review of the P waves I do not see any abnormality to the axis and duration of the P wave were higher the P waves to suggest left atrial enlargement.  Certainly do not see any notching as well.  We  will refer to cardiology for evaluation.  Patient has had a cardiac catheterization done in [place] but I am uncertain of the circumstances of when and for what reason that was done. - Ambulatory referral to Cardiology  3. Ulnar deviation of fingers of both hands Also noted there is ulnar deviations of the PIPs of both hands without tenderness of the tips.  Upon reevaluation and stabilization of the above 2 concerns we will focus on the issue of the hands and perh determine if rheumatology may be necessary.    Otilio Miu, MD

## 2022-04-29 DIAGNOSIS — I10 Essential (primary) hypertension: Secondary | ICD-10-CM | POA: Insufficient documentation

## 2022-04-29 DIAGNOSIS — I517 Cardiomegaly: Secondary | ICD-10-CM | POA: Diagnosis not present

## 2022-04-29 DIAGNOSIS — R9431 Abnormal electrocardiogram [ECG] [EKG]: Secondary | ICD-10-CM | POA: Diagnosis not present

## 2022-05-12 DIAGNOSIS — M79671 Pain in right foot: Secondary | ICD-10-CM | POA: Diagnosis not present

## 2022-05-12 DIAGNOSIS — Q6671 Congenital pes cavus, right foot: Secondary | ICD-10-CM | POA: Diagnosis not present

## 2022-05-12 DIAGNOSIS — M79672 Pain in left foot: Secondary | ICD-10-CM | POA: Diagnosis not present

## 2022-05-12 DIAGNOSIS — M19071 Primary osteoarthritis, right ankle and foot: Secondary | ICD-10-CM | POA: Diagnosis not present

## 2022-08-23 DIAGNOSIS — H16223 Keratoconjunctivitis sicca, not specified as Sjogren's, bilateral: Secondary | ICD-10-CM | POA: Diagnosis not present

## 2022-09-01 ENCOUNTER — Ambulatory Visit (INDEPENDENT_AMBULATORY_CARE_PROVIDER_SITE_OTHER): Payer: BC Managed Care – PPO | Admitting: Family Medicine

## 2022-09-01 ENCOUNTER — Encounter: Payer: Self-pay | Admitting: Family Medicine

## 2022-09-01 VITALS — BP 122/78 | HR 88 | Ht 65.0 in | Wt 225.0 lb

## 2022-09-01 DIAGNOSIS — R270 Ataxia, unspecified: Secondary | ICD-10-CM | POA: Diagnosis not present

## 2022-09-01 DIAGNOSIS — I10 Essential (primary) hypertension: Secondary | ICD-10-CM | POA: Diagnosis not present

## 2022-09-01 DIAGNOSIS — R519 Headache, unspecified: Secondary | ICD-10-CM

## 2022-09-01 DIAGNOSIS — R41840 Attention and concentration deficit: Secondary | ICD-10-CM | POA: Diagnosis not present

## 2022-09-01 MED ORDER — AMLODIPINE BESYLATE 2.5 MG PO TABS: 2.5000 mg | ORAL_TABLET | Freq: Two times a day (BID) | ORAL | 1 refills | Status: DC

## 2022-09-01 NOTE — Patient Instructions (Signed)

## 2022-09-01 NOTE — Progress Notes (Signed)
Date:  09/01/2022   Name:  Karla Powell   DOB:  Apr 29, 1956   MRN:  [id number]   Chief Complaint: Hypertension and Headache (Hurts all around the top of head, Tylenol helps, but only for 4 hours)  Hypertension This is a chronic problem. The current episode started more than 1 year ago. The problem has been waxing and waning since onset. The problem is controlled. Associated symptoms include headaches. Pertinent negatives include no blurred vision, chest pain, neck pain, orthopnea, palpitations, PND or shortness of breath. There are no associated agents to hypertension. There are no known risk factors for coronary artery disease. Past treatments include calcium channel blockers. The current treatment provides moderate improvement. There are no compliance problems.  Hypertensive end-organ damage includes CVA. There is no history of CAD/MI, left ventricular hypertrophy or retinopathy. There is no history of chronic renal disease, a hypertension causing med or renovascular disease.  Headache  This is a new problem. Episode onset: 1 month. The problem occurs intermittently. The problem has been gradually improving. The pain is located in the Bilateral (circumferential) region. The quality of the pain is described as aching, dull and pulsating. The pain is at a severity of 4/10. The pain is mild. Associated symptoms include dizziness. Pertinent negatives include no blurred vision, eye watering, facial sweating, hearing loss, loss of balance, nausea, neck pain, numbness, phonophobia, photophobia, rhinorrhea, scalp tenderness, sinus pressure, tingling, visual change, vomiting or weakness. She has tried acetaminophen for the symptoms. Her past medical history is significant for hypertension. There is no history of recent head traumas or TMJ.    No results found for: "NA", "K", "CO2", "GLUCOSE", "BUN", "CREATININE", "CALCIUM", "EGFR", "GFRNONAA" No results found for: "CHOL", "HDL", "LDLCALC", "LDLDIRECT",  "TRIG", "CHOLHDL" No results found for: "TSH" No results found for: "HGBA1C" Lab Results  Component Value Date   WBC 5.9 04/11/2021   HGB 13.2 04/11/2021   HCT 39.8 04/11/2021   MCV 90.2 04/11/2021   PLT 240 04/11/2021   No results found for: "ALT", "AST", "GGT", "ALKPHOS", "BILITOT" No results found for: "25OHVITD2", "25OHVITD3", "VD25OH"   Review of Systems  HENT:  Negative for hearing loss, rhinorrhea and sinus pressure.   Eyes:  Negative for blurred vision and photophobia.  Respiratory:  Negative for shortness of breath.   Cardiovascular:  Negative for chest pain, palpitations, orthopnea and PND.  Gastrointestinal:  Negative for nausea and vomiting.  Endocrine: Negative for polydipsia and polyuria.  Genitourinary:  Negative for difficulty urinating.  Musculoskeletal:  Negative for neck pain.  Neurological:  Positive for dizziness and headaches. Negative for tingling, facial asymmetry, speech difficulty, weakness, numbness and loss of balance.    There are no problems to display for this patient.   No Known Allergies  Past Surgical History:  Procedure Laterality Date   CESAREAN SECTION      Social History   Tobacco Use   Smoking status: Never   Smokeless tobacco: Never  Vaping Use   Vaping Use: Never used  Substance Use Topics   Alcohol use: Not Currently   Drug use: Never     Medication list has been reviewed and updated.  Current Meds  Medication Sig   amLODipine (NORVASC) 2.5 MG tablet Take 2.5 mg by mouth daily.   aspirin EC 81 MG tablet Take 81 mg by mouth daily. Swallow whole.   conjugated estrogens (PREMARIN) vaginal cream Place 1 Applicatorful vaginally daily.   meloxicam (MOBIC) 15 MG tablet Take 15 mg by mouth daily.  09/01/2022    9:20 AM 04/07/2022    3:50 PM 08/30/2020    2:39 PM  GAD 7 : Generalized Anxiety Score  Nervous, Anxious, on Edge 0 0 2  Control/stop worrying 0 0 0  Worry too much - different things 0 0 0  Trouble  relaxing 0 0 0  Restless 0 0 0  Easily annoyed or irritable 0 0 0  Afraid - awful might happen 0 0 0  Total GAD 7 Score 0 0 2  Anxiety Difficulty Not difficult at all Not difficult at all Not difficult at all       09/01/2022    9:20 AM 04/07/2022    3:49 PM 08/30/2020    2:38 PM  Depression screen PHQ 2/9  Decreased Interest 0 0 1  Down, Depressed, Hopeless 0 0 0  PHQ - 2 Score 0 0 1  Altered sleeping 0 0 0  Tired, decreased energy 0 0 0  Change in appetite 0 0 0  Feeling bad or failure about yourself  0 0 0  Trouble concentrating 0 0 0  Moving slowly or fidgety/restless 0 0 0  Suicidal thoughts 0 0 0  PHQ-9 Score 0 0 1  Difficult doing work/chores Not difficult at all Not difficult at all Not difficult at all    BP Readings from Last 3 Encounters:  09/01/22 122/78  04/07/22 118/82  02/01/22 120/71    Physical Exam Vitals and nursing note reviewed. Exam conducted with a chaperone present.  Constitutional:      General: She is not in acute distress.    Appearance: She is not diaphoretic.  HENT:     Head: Normocephalic and atraumatic.     Right Ear: External ear normal.     Left Ear: External ear normal.     Nose: Nose normal.  Eyes:     General:        Right eye: No discharge.        Left eye: No discharge.     Extraocular Movements: Extraocular movements intact.     Conjunctiva/sclera: Conjunctivae normal.     Pupils: Pupils are equal, round, and reactive to light.  Neck:     Thyroid: No thyromegaly.     Vascular: No carotid bruit.  Cardiovascular:     Rate and Rhythm: Normal rate and regular rhythm.     Heart sounds: Normal heart sounds, S1 normal and S2 normal. No murmur heard.    No systolic murmur is present.     No diastolic murmur is present.     No friction rub. No gallop. No S3 or S4 sounds.  Pulmonary:     Effort: Pulmonary effort is normal.     Breath sounds: Normal breath sounds. No decreased breath sounds, wheezing, rhonchi or rales.   Abdominal:     General: Bowel sounds are normal.     Palpations: Abdomen is soft. There is no hepatomegaly, splenomegaly or mass.     Tenderness: There is no abdominal tenderness. There is no guarding.  Musculoskeletal:     Cervical back: Neck supple.     Right lower leg: No edema.     Left lower leg: No edema.  Lymphadenopathy:     Cervical: No cervical adenopathy.  Skin:    General: Skin is warm.  Neurological:     Mental Status: She is alert.     Cranial Nerves: Cranial nerves 2-12 are intact.     Sensory: Sensation is intact.  Motor: Motor function is intact.     Coordination: Romberg sign negative.     Deep Tendon Reflexes: Reflexes are normal and symmetric.     Wt Readings from Last 3 Encounters:  09/01/22 225 lb (102.1 kg)  04/07/22 230 lb (104.3 kg)  02/01/22 215 lb (97.5 kg)    BP 122/78   Pulse 88   Ht 5' 5"$  (1.651 m)   Wt 225 lb (102.1 kg)   SpO2 97%   BMI 37.44 kg/m   Assessment and Plan:  1. Primary hypertension Chronic.  Controlled.  Stable.  Current blood pressure is 122/78.  Current medication regimen is amlodipine 2.5 mg in the morning and 1.25 mg at night.  We will write for this is 1 twice a day because she had a strokelike incident when her blood pressure got too low during the pregnancy so we would rather spread out the half-life of the medication over 12 hours and take it twice a day.  She is currently tolerating this regimen and we will recheck patient in 6 months. - amLODipine (NORVASC) 2.5 MG tablet; Take 1 tablet (2.5 mg total) by mouth every 12 (twelve) hours.  Dispense: 180 tablet; Refill: 1  2. New onset headache New onset.  Persistent.  Patient began having circumferential headaches with some discomfort in the left posterior aspect.  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  Patient is also having issues recently with concentration particular with memory and math and has a wider base gait  than normal which may be her baseline and may be residual from the previous cerebrovascular incident.  We will refer to neurology for evaluation of headaches and new onset of cognitive concerns and will recheck on an as-needed basis. - Ambulatory referral to Neurology  3. Difficulty concentrating As noted above - Ambulatory referral to Neurology  4. Ataxia As noted above - Ambulatory referral to Neurology    Otilio Miu, MD

## 2022-09-02 LAB — RENAL FUNCTION PANEL
Albumin: 4.6 g/dL (ref 3.9–4.9)
BUN/Creatinine Ratio: 18 (ref 12–28)
BUN: 15 mg/dL (ref 8–27)
CO2: 21 mmol/L (ref 20–29)
Calcium: 9.5 mg/dL (ref 8.7–10.3)
Chloride: 106 mmol/L (ref 96–106)
Creatinine, Ser: 0.83 mg/dL (ref 0.57–1.00)
Glucose: 90 mg/dL (ref 70–99)
Phosphorus: 4.1 mg/dL (ref 3.0–4.3)
Potassium: 4.3 mmol/L (ref 3.5–5.2)
Sodium: 143 mmol/L (ref 134–144)
eGFR: 79 mL/min/{1.73_m2} (ref >59)

## 2022-09-03 DIAGNOSIS — G3184 Mild cognitive impairment, so stated: Secondary | ICD-10-CM | POA: Diagnosis not present

## 2022-09-03 DIAGNOSIS — R42 Dizziness and giddiness: Secondary | ICD-10-CM | POA: Diagnosis not present

## 2022-09-03 DIAGNOSIS — R519 Headache, unspecified: Secondary | ICD-10-CM | POA: Diagnosis not present

## 2022-09-03 DIAGNOSIS — R0683 Snoring: Secondary | ICD-10-CM | POA: Diagnosis not present

## 2022-09-04 DIAGNOSIS — R519 Headache, unspecified: Secondary | ICD-10-CM | POA: Diagnosis not present

## 2022-09-08 ENCOUNTER — Other Ambulatory Visit: Payer: Self-pay | Admitting: Student

## 2022-09-08 DIAGNOSIS — G3184 Mild cognitive impairment, so stated: Secondary | ICD-10-CM

## 2022-09-08 DIAGNOSIS — R42 Dizziness and giddiness: Secondary | ICD-10-CM

## 2022-09-08 DIAGNOSIS — R519 Headache, unspecified: Secondary | ICD-10-CM

## 2022-09-20 ENCOUNTER — Ambulatory Visit: Admission: RE | Admit: 2022-09-20 | Payer: BC Managed Care – PPO | Source: Ambulatory Visit

## 2022-09-27 ENCOUNTER — Ambulatory Visit: Payer: BC Managed Care – PPO

## 2022-09-29 ENCOUNTER — Other Ambulatory Visit: Payer: Self-pay | Admitting: Student

## 2022-09-29 DIAGNOSIS — G3184 Mild cognitive impairment, so stated: Secondary | ICD-10-CM

## 2022-09-29 DIAGNOSIS — R42 Dizziness and giddiness: Secondary | ICD-10-CM

## 2022-09-29 DIAGNOSIS — R519 Headache, unspecified: Secondary | ICD-10-CM

## 2022-09-30 DIAGNOSIS — G4733 Obstructive sleep apnea (adult) (pediatric): Secondary | ICD-10-CM | POA: Diagnosis not present

## 2022-10-07 ENCOUNTER — Encounter: Payer: Self-pay | Admitting: Student

## 2022-10-08 ENCOUNTER — Encounter: Payer: Self-pay | Admitting: Student

## 2022-10-11 ENCOUNTER — Ambulatory Visit
Admission: RE | Admit: 2022-10-11 | Discharge: 2022-10-11 | Disposition: A | Discharge status: Home / Self Care | Payer: BC Managed Care – PPO | Source: Ambulatory Visit | Attending: Student | Admitting: Student

## 2022-10-11 ENCOUNTER — Ambulatory Visit: Payer: BC Managed Care – PPO

## 2022-10-11 DIAGNOSIS — R519 Headache, unspecified: Secondary | ICD-10-CM | POA: Diagnosis not present

## 2022-10-11 DIAGNOSIS — R42 Dizziness and giddiness: Secondary | ICD-10-CM

## 2022-10-11 DIAGNOSIS — G3184 Mild cognitive impairment, so stated: Secondary | ICD-10-CM

## 2022-11-13 DIAGNOSIS — Z789 Other specified health status: Secondary | ICD-10-CM | POA: Diagnosis not present

## 2022-11-13 DIAGNOSIS — Z6837 Body mass index (BMI) 37.0-37.9, adult: Secondary | ICD-10-CM | POA: Diagnosis not present

## 2022-11-13 DIAGNOSIS — G4733 Obstructive sleep apnea (adult) (pediatric): Secondary | ICD-10-CM | POA: Diagnosis not present

## 2022-12-04 DIAGNOSIS — G3184 Mild cognitive impairment, so stated: Secondary | ICD-10-CM | POA: Diagnosis not present

## 2022-12-04 DIAGNOSIS — R519 Headache, unspecified: Secondary | ICD-10-CM | POA: Diagnosis not present

## 2022-12-04 DIAGNOSIS — G4733 Obstructive sleep apnea (adult) (pediatric): Secondary | ICD-10-CM | POA: Diagnosis not present

## 2023-01-01 ENCOUNTER — Telehealth: Payer: Self-pay | Admitting: Family Medicine

## 2023-01-01 NOTE — Telephone Encounter (Signed)
Pt is calling to request a referral to weight loss clinic. CB- [phone] Preference in [place]

## 2023-01-02 NOTE — Telephone Encounter (Signed)
PC to pt, gave Dr. Lesia Sago number no referral needed.

## 2023-01-08 DIAGNOSIS — G4733 Obstructive sleep apnea (adult) (pediatric): Secondary | ICD-10-CM | POA: Diagnosis not present

## 2023-02-07 DIAGNOSIS — G4733 Obstructive sleep apnea (adult) (pediatric): Secondary | ICD-10-CM | POA: Diagnosis not present

## 2023-03-06 DIAGNOSIS — G3184 Mild cognitive impairment, so stated: Secondary | ICD-10-CM | POA: Diagnosis not present

## 2023-03-06 DIAGNOSIS — R519 Headache, unspecified: Secondary | ICD-10-CM | POA: Diagnosis not present

## 2023-03-06 DIAGNOSIS — G4733 Obstructive sleep apnea (adult) (pediatric): Secondary | ICD-10-CM | POA: Diagnosis not present

## 2023-03-09 ENCOUNTER — Encounter: Payer: Self-pay | Admitting: Family Medicine

## 2023-03-09 ENCOUNTER — Ambulatory Visit
Admission: RE | Admit: 2023-03-09 | Discharge: 2023-03-09 | Disposition: A | Discharge status: Home / Self Care | Payer: BC Managed Care – PPO | Source: Ambulatory Visit | Attending: Family Medicine | Admitting: Family Medicine

## 2023-03-09 ENCOUNTER — Ambulatory Visit (INDEPENDENT_AMBULATORY_CARE_PROVIDER_SITE_OTHER): Payer: BC Managed Care – PPO | Admitting: Family Medicine

## 2023-03-09 ENCOUNTER — Ambulatory Visit
Admission: RE | Admit: 2023-03-09 | Discharge: 2023-03-09 | Disposition: A | Discharge status: Home / Self Care | Payer: BC Managed Care – PPO | Attending: Family Medicine | Admitting: Family Medicine

## 2023-03-09 VITALS — BP 120/70 | HR 66 | Ht 65.0 in | Wt 227.0 lb

## 2023-03-09 DIAGNOSIS — I10 Essential (primary) hypertension: Secondary | ICD-10-CM

## 2023-03-09 DIAGNOSIS — I872 Venous insufficiency (chronic) (peripheral): Secondary | ICD-10-CM

## 2023-03-09 DIAGNOSIS — R0600 Dyspnea, unspecified: Secondary | ICD-10-CM

## 2023-03-09 DIAGNOSIS — M47812 Spondylosis without myelopathy or radiculopathy, cervical region: Secondary | ICD-10-CM | POA: Diagnosis not present

## 2023-03-09 DIAGNOSIS — M5030 Other cervical disc degeneration, unspecified cervical region: Secondary | ICD-10-CM | POA: Diagnosis not present

## 2023-03-09 DIAGNOSIS — M5412 Radiculopathy, cervical region: Secondary | ICD-10-CM | POA: Diagnosis not present

## 2023-03-09 DIAGNOSIS — U071 COVID-19: Secondary | ICD-10-CM | POA: Diagnosis not present

## 2023-03-09 MED ORDER — AMLODIPINE BESYLATE 2.5 MG PO TABS: 2.5000 mg | ORAL_TABLET | Freq: Two times a day (BID) | ORAL | 1 refills | Status: AC

## 2023-03-09 NOTE — Progress Notes (Signed)
Date:  03/09/2023   Name:  Karla Powell   DOB:  Jan 05, 1956   MRN:  [id number]   Chief Complaint: Hypertension  Hypertension This is a chronic problem. The current episode started more than 1 year ago. The problem has been waxing and waning since onset. The problem is controlled. Associated symptoms include shortness of breath. Pertinent negatives include no blurred vision, chest pain, headaches, orthopnea, palpitations or PND. There are no associated agents to hypertension. Past treatments include calcium channel blockers. The current treatment provides mild improvement.  Arm Pain  The incident occurred more than 1 week ago (2 months). The pain is present in the upper left arm and upper right arm. The quality of the pain is described as aching. Pertinent negatives include no chest pain.  Shortness of Breath This is a new problem. The current episode started 1 to 4 weeks ago. Episode frequency: mostly in am. The problem has been waxing and waning. Pertinent negatives include no abdominal pain, chest pain, headaches, leg swelling, orthopnea, PND or wheezing.    Lab Results  Component Value Date   NA 143 09/01/2022   K 4.3 09/01/2022   CO2 21 09/01/2022   GLUCOSE 90 09/01/2022   BUN 15 09/01/2022   CREATININE 0.83 09/01/2022   CALCIUM 9.5 09/01/2022   EGFR 79 09/01/2022   No results found for: "CHOL", "HDL", "LDLCALC", "LDLDIRECT", "TRIG", "CHOLHDL" No results found for: "TSH" No results found for: "HGBA1C" Lab Results  Component Value Date   WBC 5.9 04/11/2021   HGB 13.2 04/11/2021   HCT 39.8 04/11/2021   MCV 90.2 04/11/2021   PLT 240 04/11/2021   No results found for: "ALT", "AST", "GGT", "ALKPHOS", "BILITOT" No results found for: "25OHVITD2", "25OHVITD3", "VD25OH"   Review of Systems  Eyes:  Negative for blurred vision.  Respiratory:  Positive for shortness of breath. Negative for cough, chest tightness and wheezing.   Cardiovascular:  Negative for chest pain,  palpitations, orthopnea, leg swelling and PND.  Gastrointestinal:  Negative for abdominal pain and constipation.  Genitourinary:  Negative for difficulty urinating.  Musculoskeletal:  Negative for arthralgias.  Neurological:  Negative for headaches.    There are no problems to display for this patient.   No Known Allergies  Past Surgical History:  Procedure Laterality Date   CESAREAN SECTION      Social History   Tobacco Use   Smoking status: Never   Smokeless tobacco: Never  Vaping Use   Vaping status: Never Used  Substance Use Topics   Alcohol use: Not Currently   Drug use: Never     Medication list has been reviewed and updated.  Current Meds  Medication Sig   amLODipine (NORVASC) 2.5 MG tablet Take 1 tablet (2.5 mg total) by mouth every 12 (twelve) hours. (Patient taking differently: Take 2.5 mg by mouth 2 (two) times daily.)   aspirin EC 81 MG tablet Take 81 mg by mouth daily. Swallow whole.   cholecalciferol (VITAMIN D3) 25 MCG (1000 UNIT) tablet Take 1,000 Units by mouth daily.   meloxicam (MOBIC) 15 MG tablet Take 15 mg by mouth daily.       03/09/2023    3:02 PM 09/01/2022    9:20 AM 04/07/2022    3:50 PM 08/30/2020    2:39 PM  GAD 7 : Generalized Anxiety Score  Nervous, Anxious, on Edge 0 0 0 2  Control/stop worrying 0 0 0 0  Worry too much - different things 0 0 0 0  Trouble relaxing 0 0 0 0  Restless 0 0 0 0  Easily annoyed or irritable 0 0 0 0  Afraid - awful might happen 0 0 0 0  Total GAD 7 Score 0 0 0 2  Anxiety Difficulty Not difficult at all Not difficult at all Not difficult at all Not difficult at all       03/09/2023    3:01 PM 09/01/2022    9:20 AM 04/07/2022    3:49 PM  Depression screen PHQ 2/9  Decreased Interest 0 0 0  Down, Depressed, Hopeless 0 0 0  PHQ - 2 Score 0 0 0  Altered sleeping 0 0 0  Tired, decreased energy 0 0 0  Change in appetite 0 0 0  Feeling bad or failure about yourself  0 0 0  Trouble concentrating 0 0 0   Moving slowly or fidgety/restless 0 0 0  Suicidal thoughts 0 0 0  PHQ-9 Score 0 0 0  Difficult doing work/chores Not difficult at all Not difficult at all Not difficult at all    BP Readings from Last 3 Encounters:  03/09/23 120/70  09/01/22 122/78  04/07/22 118/82    Physical Exam Vitals and nursing note reviewed.  Constitutional:      Appearance: She is well-developed.  HENT:     Head: Normocephalic.     Right Ear: External ear normal.     Left Ear: External ear normal.  Eyes:     General: Lids are everted, no foreign bodies appreciated. No scleral icterus.       Left eye: No foreign body or hordeolum.     Conjunctiva/sclera: Conjunctivae normal.     Right eye: Right conjunctiva is not injected.     Left eye: Left conjunctiva is not injected.     Pupils: Pupils are equal, round, and reactive to light.  Neck:     Thyroid: No thyromegaly.     Vascular: No JVD.     Trachea: No tracheal deviation.  Cardiovascular:     Rate and Rhythm: Normal rate and regular rhythm.     Heart sounds: Normal heart sounds. No murmur heard.    No friction rub. No gallop.  Pulmonary:     Effort: Pulmonary effort is normal. No respiratory distress.     Breath sounds: Normal breath sounds. No wheezing, rhonchi or rales.  Abdominal:     General: Bowel sounds are normal.     Palpations: Abdomen is soft. There is no mass.     Tenderness: There is no abdominal tenderness. There is no guarding or rebound.  Musculoskeletal:        General: No tenderness. Normal range of motion.     Cervical back: Normal range of motion and neck supple.  Lymphadenopathy:     Cervical: No cervical adenopathy.  Skin:    General: Skin is warm.     Findings: No rash.  Neurological:     Mental Status: She is alert and oriented to person, place, and time.     Cranial Nerves: No cranial nerve deficit.     Deep Tendon Reflexes: Reflexes normal.  Psychiatric:        Mood and Affect: Mood is not anxious or depressed.      Wt Readings from Last 3 Encounters:  03/09/23 227 lb (103 kg)  09/01/22 225 lb (102.1 kg)  04/07/22 230 lb (104.3 kg)    BP 120/70   Pulse 66   Ht 5\' 5"  (1.651 m)  Wt 227 lb (103 kg)   SpO2 99%   BMI 37.77 kg/m   Assessment and Plan: 1. Primary hypertension Chronic.  Controlled.  Stable.  Excellent results with amlodipine 2.5 mg twice a day.  Blood pressure is 120/70 and will continue with current dosing. - amLODipine (NORVASC) 2.5 MG tablet; Take 1 tablet (2.5 mg total) by mouth 2 (two) times daily.  Dispense: 180 tablet; Refill: 1  2. Dyspnea, unspecified type Patient's had increasing dyspnea on exertion and noted that patient had an abnormal chest x-ray that was done after she had COVID over a year ago.  We will repeat chest x-ray to see if this has resolved and we will likely refer to cardiology and that there is some concern for enlargement of the heart although the EKG only suggest that the atrium is enlarged on 04/29/2022.  Patient has not had formal evaluation with echocardiogram and we will refer to cardiology for determination of such. - DG Chest 2 View - Ambulatory referral to Cardiology  3. Venous insufficiency Chronic.  Controlled.  Followed in [place] by vein and vascular.  Patient states that she does wear compression stockings but does not have them on today.  Her swelling that she is noting may be due to the low dose amlodipine that she is on or it may be due to her venous insufficiency and we will continue to monitor.  4. Cervical radiculopathy Patient has bilateral lateral upper arm pain which is not exacerbated by movement or or position.  Patient wakes up with it but during the course a day it does improve some I am wondering if this may be due to the position of her neck and that there is likelihood of cervical radiculopathy.  Will do an x-ray of the neck to see if there is a degree of arthritis. - DG Cervical Spine Complete     Elizabeth Sauer, MD

## 2023-03-09 NOTE — Patient Instructions (Signed)

## 2023-03-10 DIAGNOSIS — G4733 Obstructive sleep apnea (adult) (pediatric): Secondary | ICD-10-CM | POA: Diagnosis not present

## 2023-04-03 NOTE — Progress Notes (Unsigned)
   Rubin Payor, PhD, LAT, ATC acting as a scribe for Clementeen Graham, MD.  Labarbara Mehall is a 67 y.o. female who presents to [facility] today for bilateral arm pain x ***. Pt locates pain to ***  Radiates: Paresthesia: Grip strength: Aggravates: Treatments tried:  Pertinent review of systems: ***  Relevant historical information: ***   Exam:  There were no vitals taken for this visit. General: Well Developed, well nourished, and in no acute distress.   MSK: ***    Lab and Radiology Results No results found for this or any previous visit (from the past 72 hour(s)). No results found.     Assessment and Plan: 67 y.o. female with ***   PDMP not reviewed this encounter. No orders of the defined types were placed in this encounter.  No orders of the defined types were placed in this encounter.    Discussed warning signs or symptoms. Please see discharge instructions. Patient expresses understanding.   ***

## 2023-04-06 ENCOUNTER — Other Ambulatory Visit: Payer: Self-pay

## 2023-04-06 ENCOUNTER — Ambulatory Visit (INDEPENDENT_AMBULATORY_CARE_PROVIDER_SITE_OTHER): Payer: BC Managed Care – PPO

## 2023-04-06 ENCOUNTER — Encounter: Payer: Self-pay | Admitting: Family Medicine

## 2023-04-06 ENCOUNTER — Ambulatory Visit: Payer: BC Managed Care – PPO | Admitting: Family Medicine

## 2023-04-06 VITALS — BP 118/84 | HR 66 | Ht 65.0 in | Wt 223.0 lb

## 2023-04-06 DIAGNOSIS — M79601 Pain in right arm: Secondary | ICD-10-CM | POA: Diagnosis not present

## 2023-04-06 DIAGNOSIS — M79602 Pain in left arm: Secondary | ICD-10-CM

## 2023-04-06 DIAGNOSIS — M19012 Primary osteoarthritis, left shoulder: Secondary | ICD-10-CM | POA: Diagnosis not present

## 2023-04-06 DIAGNOSIS — M064 Inflammatory polyarthropathy: Secondary | ICD-10-CM | POA: Diagnosis not present

## 2023-04-06 DIAGNOSIS — M19011 Primary osteoarthritis, right shoulder: Secondary | ICD-10-CM | POA: Diagnosis not present

## 2023-04-06 LAB — SEDIMENTATION RATE: Sed Rate: 25 mm/hr (ref 0–30)

## 2023-04-06 LAB — CK: Total CK: 33 U/L (ref 7–177)

## 2023-04-06 NOTE — Patient Instructions (Addendum)
Thank you for coming in today.   Please get an Xray today before you leave   Please get labs today before you leave   I've referred you to Physical Therapy.  Let us know if you don't hear from them in one week.   Check back in 6 weeks

## 2023-04-07 NOTE — Progress Notes (Signed)
Left shoulder x-ray shows some throat at the small joint the top of the shoulder the [facility] joint and some irritation where the rotator cuff tendon attaches to the shoulder.

## 2023-04-07 NOTE — Progress Notes (Signed)
Right shoulder x-ray shows some arthritis at the small joint the top of the shoulder

## 2023-04-09 NOTE — Progress Notes (Signed)
Rheumatology labs are negative

## 2023-05-21 ENCOUNTER — Ambulatory Visit: Payer: BC Managed Care – PPO | Admitting: Family Medicine

## 2023-05-22 ENCOUNTER — Ambulatory Visit: Payer: BC Managed Care – PPO | Admitting: Cardiology

## 2023-08-26 ENCOUNTER — Ambulatory Visit
Admission: RE | Admit: 2023-08-26 | Discharge: 2023-08-26 | Disposition: A | Discharge status: Home / Self Care | Payer: BC Managed Care – PPO | Source: Ambulatory Visit | Attending: Family Medicine | Admitting: Family Medicine

## 2023-08-26 VITALS — BP 108/77 | HR 65 | Temp 98.2°F | Resp 17

## 2023-08-26 DIAGNOSIS — J069 Acute upper respiratory infection, unspecified: Secondary | ICD-10-CM

## 2023-08-26 LAB — RESP PANEL BY RT-PCR (FLU A&B, COVID) ARPGX2
Influenza A by PCR: NEGATIVE
Influenza B by PCR: NEGATIVE
SARS Coronavirus 2 by RT PCR: NEGATIVE

## 2023-08-26 LAB — GROUP A STREP BY PCR: Group A Strep by PCR: NOT DETECTED

## 2023-08-26 NOTE — Discharge Instructions (Addendum)
 Your strep, COVID and influenza tests are negative. You have a viral respiratory infection that will gradually improve over the next 7-10 days. Cough may last up to 3 weeks.   If your were prescribed medication, stop by the pharmacy to pick them up.   You can take Tylenol and/or Ibuprofen as needed for fever reduction and pain relief.    For cough: honey 1/2 to 1 teaspoon (you can dilute the honey in water or another fluid).  You can also use guaifenesin and dextromethorphan for cough. You can use a humidifier for chest congestion and cough.  If you don't have a humidifier, you can sit in the bathroom with the hot shower running.      For sore throat: try warm salt water gargles, Mucinex sore throat cough drops or cepacol lozenges, throat spray, warm tea or water with lemon/honey, popsicles or ice, or OTC cold relief medicine for throat discomfort. You can also purchase chloraseptic spray at the pharmacy or dollar store.   For congestion: take a daily anti-histamine like Zyrtec, Claritin, and a oral decongestant, such as pseudoephedrine.  You can also use Flonase 1-2 sprays in each nostril daily. Afrin is also a good option, if you do not have high blood pressure.    It is important to stay hydrated: drink plenty of fluids (water, gatorade/powerade/pedialyte, juices, or teas) to keep your throat moisturized and help further relieve irritation/discomfort.    Return or go to the Emergency Department if symptoms worsen or do not improve in the next few days

## 2023-08-26 NOTE — ED Triage Notes (Signed)
 Exposure to flu and covid. Sore throat x 2 days No fever

## 2023-08-26 NOTE — ED Provider Notes (Signed)
 [facility]    CSN: [id number] Arrival date & time: 08/26/23  1428      History   Chief Complaint Chief Complaint  Patient presents with   Sore Throat    Spots on throat - Entered by patient   Covid Exposure    HPI Karla Powell is a 68 y.o. female.   HPI  History obtained from the patient. [place] presents for sore throat for the past 2 days.  Her mother has been sick with the flu and COVID.  She requests strep, influenza and COVID testing.    The inside of her lips felt different but she gargled with warm salty water which helped.  No fever, vomiting and diarrhea. Enorsdes rhinorrhea, nasla congestion and slight cough.        Past Medical History:  Diagnosis Date   Hypertension    Stroke [facility])     Patient Active Problem List   Diagnosis Date Noted   Inflammatory polyarthropathy (HCC) 04/06/2023   Benign essential HTN 04/29/2022    Past Surgical History:  Procedure Laterality Date   CESAREAN SECTION      OB History   No obstetric history on file.      Home Medications    Prior to Admission medications   Medication Sig Start Date End Date Taking? Authorizing Provider  amLODipine  (NORVASC ) 2.5 MG tablet Take 1 tablet (2.5 mg total) by mouth 2 (two) times daily. 03/09/23  Yes Joshua Cathryne BROCKS, MD  aspirin EC 81 MG tablet Take 81 mg by mouth daily. Swallow whole.   Yes [provider]  conjugated estrogens (PREMARIN) vaginal cream Place 1 Applicatorful vaginally daily.   Yes [provider]  meloxicam (MOBIC) 15 MG tablet Take 15 mg by mouth daily.   Yes [provider]  cholecalciferol (VITAMIN D3) 25 MCG (1000 UNIT) tablet Take 1,000 Units by mouth daily.    [provider]    Family History Family History  Problem Relation Age of Onset   Heart disease Father    Hypertension Father    Stroke Maternal Grandmother    Stroke Maternal Grandfather     Social History Social History   Tobacco Use    Smoking status: Never   Smokeless tobacco: Never  Vaping Use   Vaping status: Never Used  Substance Use Topics   Alcohol use: Not Currently   Drug use: Never     Allergies   Patient has no known allergies.   Review of Systems Review of Systems: negative unless otherwise stated in HPI.      Physical Exam Triage Vital Signs ED Triage Vitals  Encounter Vitals Group     BP 08/26/23 1440 108/77     Systolic BP Percentile --      Diastolic BP Percentile --      Pulse Rate 08/26/23 1440 65     Resp 08/26/23 1440 17     Temp 08/26/23 1440 98.2 F (36.8 C)     Temp Source 08/26/23 1440 Oral     SpO2 08/26/23 1440 100 %     Weight --      Height --      Head Circumference --      Peak Flow --      Pain Score 08/26/23 1439 4     Pain Loc --      Pain Education --      Exclude from Growth Chart --    No data found.  Updated Vital  Signs BP 108/77 (BP Location: Left Arm)   Pulse 65   Temp 98.2 F (36.8 C) (Oral)   Resp 17   SpO2 100%   Visual Acuity Right Eye Distance:   Left Eye Distance:   Bilateral Distance:    Right Eye Near:   Left Eye Near:    Bilateral Near:     Physical Exam GEN:     alert, non-toxic appearing female in no distress    HENT:  mucus membranes moist, oropharyngeal without lesions or erythema, no tonsillar hypertrophy or exudates,  no nasal discharge, left TM normal, right TM with scarring  EYES:   no scleral injection or discharge NECK:  normal ROM, no meningismus   RESP:  no increased work of breathing, clear to auscultation bilaterally CVS:   regular rate and rhythm Skin:   warm and dry    UC Treatments / Results  Labs (all labs ordered are listed, but only abnormal results are displayed) Labs Reviewed  RESP PANEL BY RT-PCR (FLU A&B, COVID) [id number]  GROUP A STREP BY PCR    EKG   Radiology No results found.  Procedures Procedures (including critical care time)  Medications Ordered in UC Medications - No data to  display  Initial Impression / Assessment and Plan / UC Course  I have reviewed the triage vital signs and the nursing notes.  Pertinent labs & imaging results that were available during my care of the patient were reviewed by me and considered in my medical decision making (see chart for details).       Pt is a 68 y.o. female who presents for 2 days of respiratory symptoms. Li is afebrile here without recent antipyretics. Satting well on room air. Overall pt is non-toxic appearing, well hydrated, without respiratory distress. Pulmonary exam is unremarkable.  COVID and influenza panel obtained and was negative. Strep pcr is negative. Suspect viral respiratory illness. Discussed symptomatic treatment.  Explained lack of efficacy of antibiotics in viral disease.  Typical duration of symptoms discussed.   Return and ED precautions given and voiced understanding. Discussed MDM, treatment plan and plan for follow-up with patient who agrees with plan.     Final Clinical Impressions(s) / UC Diagnoses   Final diagnoses:  Viral URI with cough     Discharge Instructions      Your strep, COVID and influenza tests are negative. You have a viral respiratory infection that will gradually improve over the next 7-10 days. Cough may last up to 3 weeks.   If your were prescribed medication, stop by the pharmacy to pick them up.   You can take Tylenol and/or Ibuprofen as needed for fever reduction and pain relief.    For cough: honey 1/2 to 1 teaspoon (you can dilute the honey in water or another fluid).  You can also use guaifenesin and dextromethorphan for cough. You can use a humidifier for chest congestion and cough.  If you don't have a humidifier, you can sit in the bathroom with the hot shower running.      For sore throat: try warm salt water gargles, Mucinex sore throat cough drops or cepacol lozenges, throat spray, warm tea or water with lemon/honey, popsicles or ice, or OTC cold  relief medicine for throat discomfort. You can also purchase chloraseptic spray at the pharmacy or dollar store.   For congestion: take a daily anti-histamine like Zyrtec, Claritin, and a oral decongestant, such as pseudoephedrine.  You can also use Flonase 1-2 sprays in  each nostril daily. Afrin is also a good option, if you do not have high blood pressure.    It is important to stay hydrated: drink plenty of fluids (water, gatorade/powerade/pedialyte, juices, or teas) to keep your throat moisturized and help further relieve irritation/discomfort.    Return or go to the Emergency Department if symptoms worsen or do not improve in the next few days      ED Prescriptions   None    PDMP not reviewed this encounter.   Jameire Kouba, DO 08/26/23 [id number]

## 2023-09-12 IMAGING — CR DG CHEST 2V
2 series · 2 of 2 positions shown · non-contrast
Comparison: None.

CLINICAL DATA: Cough, COVID positive

EXAM:
CHEST - 2 VIEW

[chest pa]
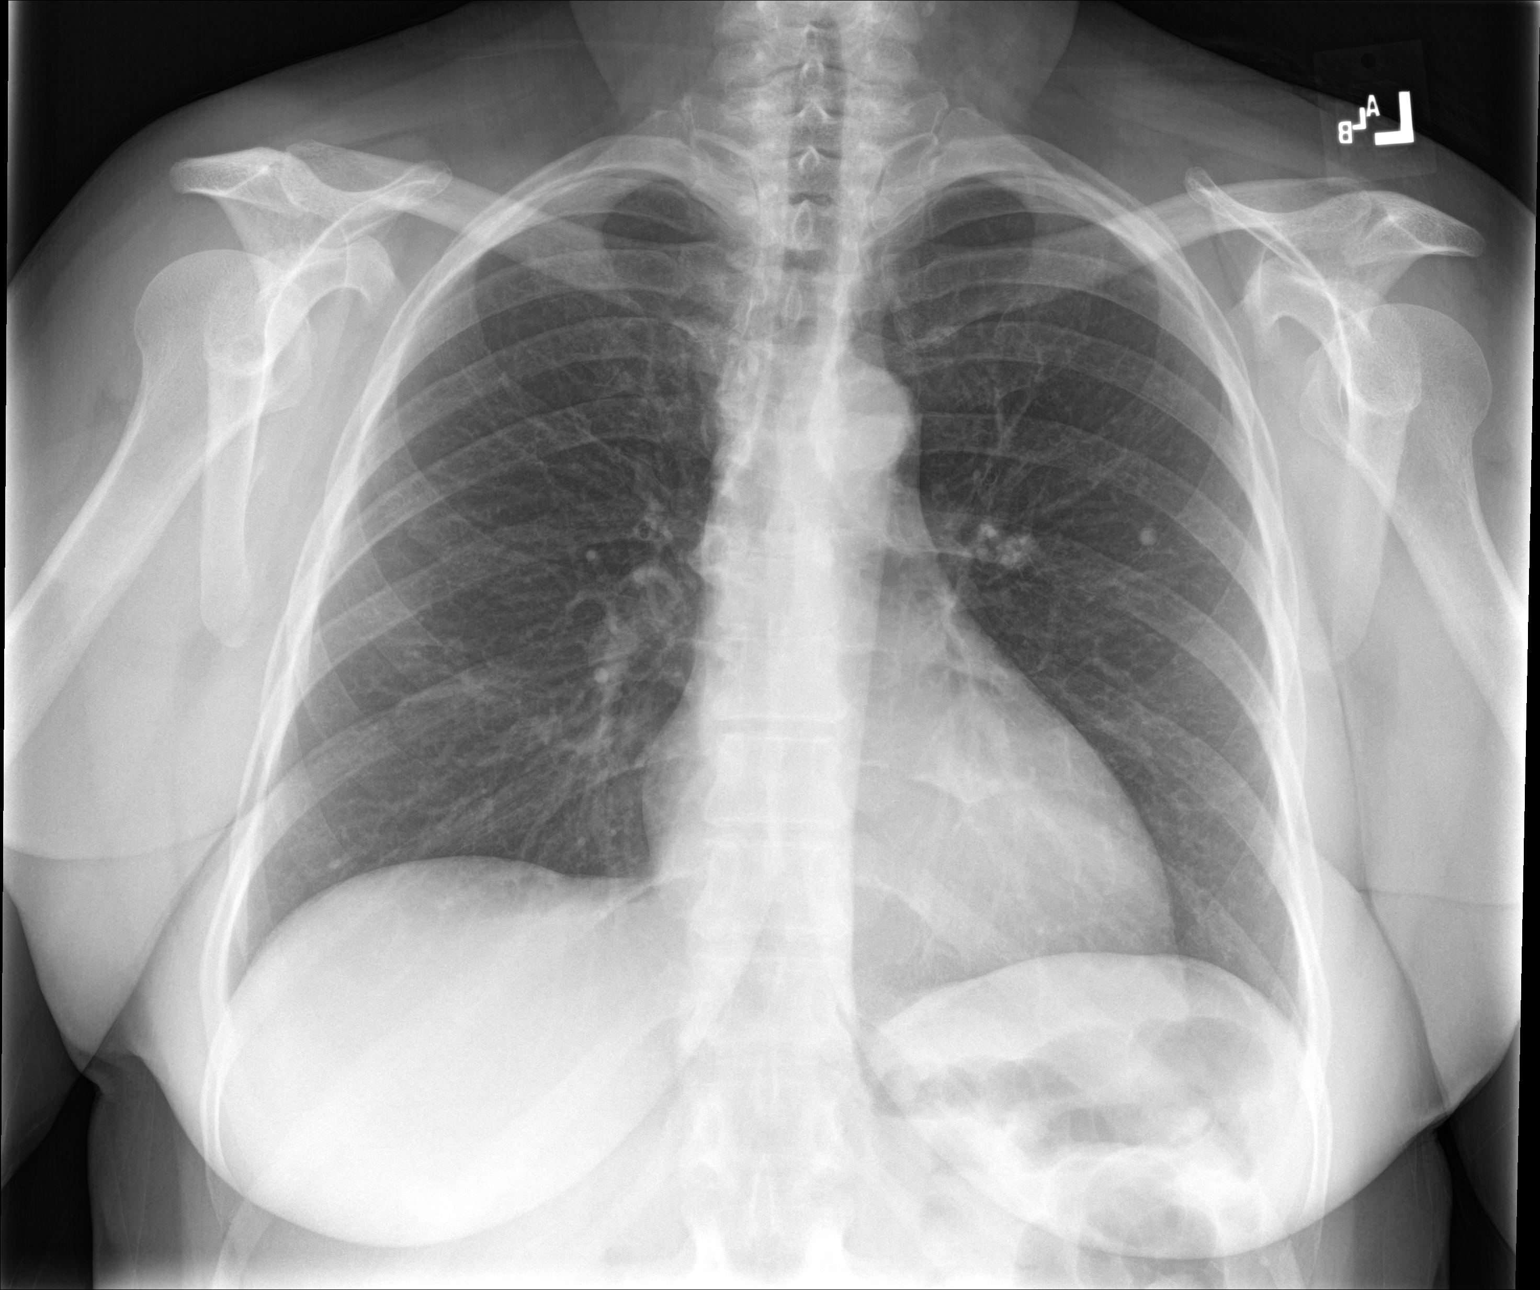

[chest lat]
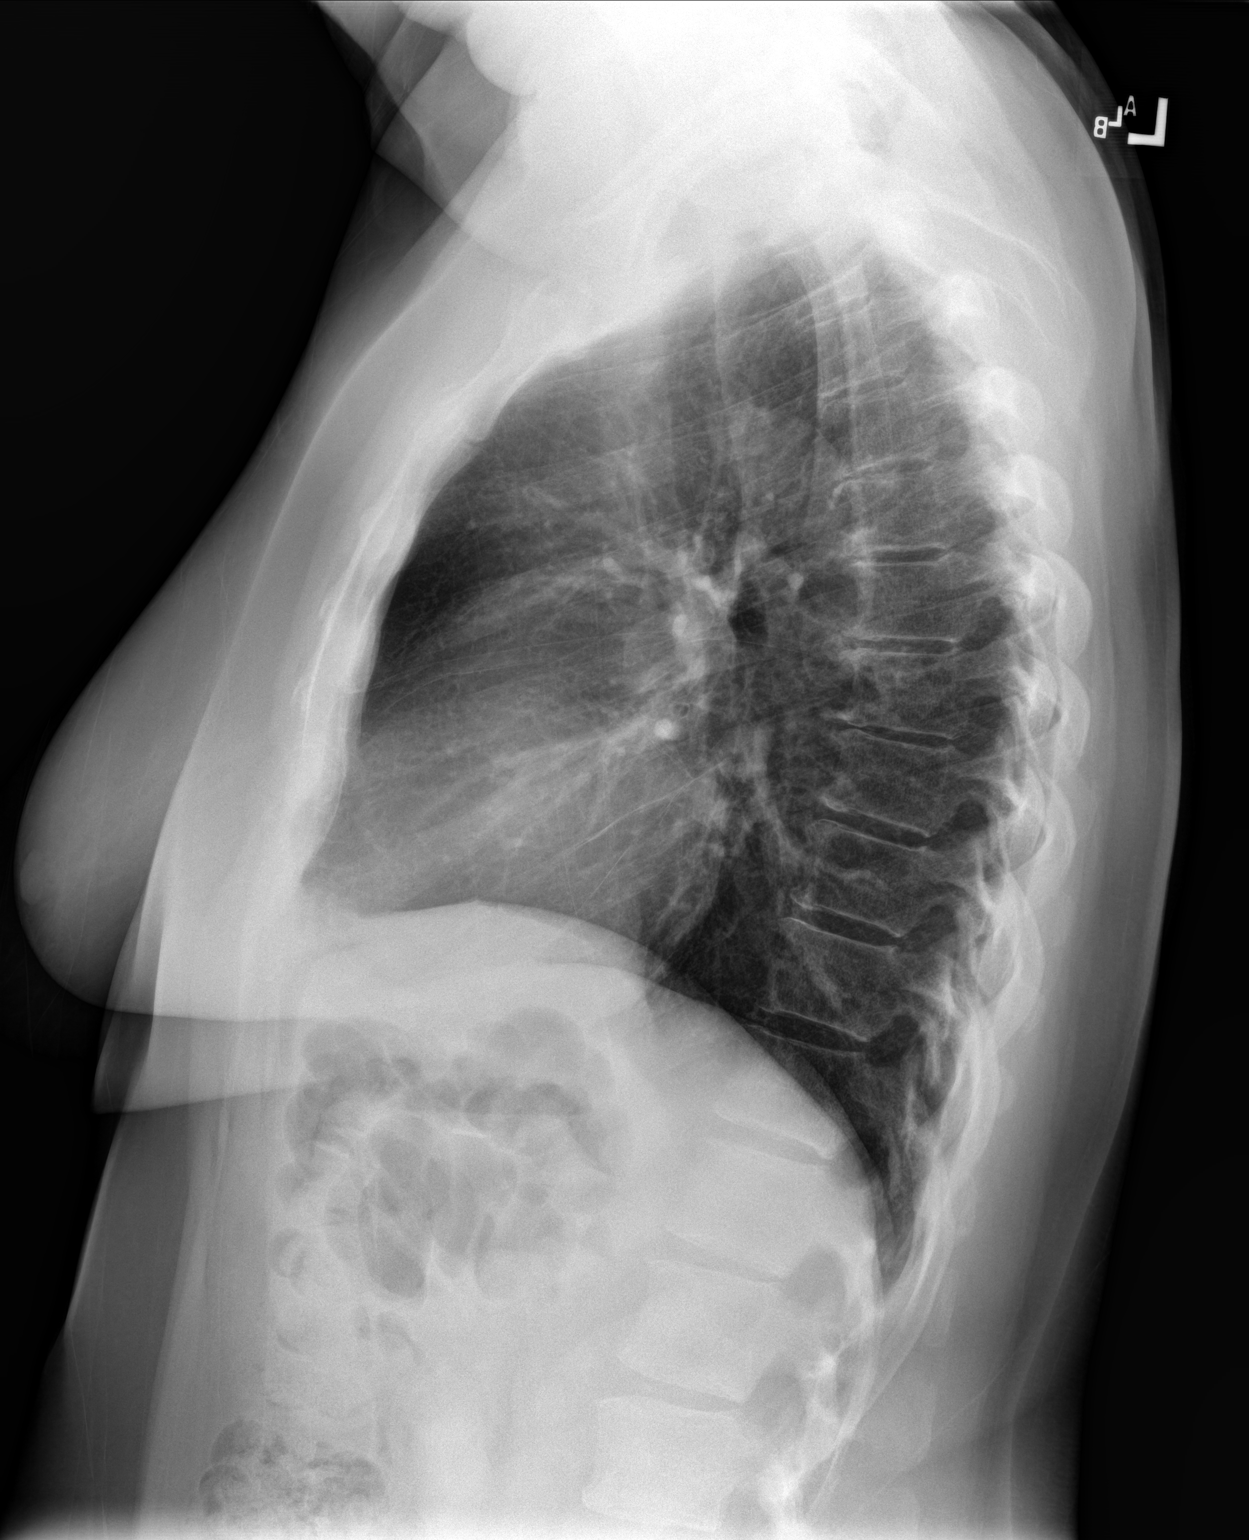

[2 of 2 positions shown; findings below may reference images not displayed]

FINDINGS: Benign calcified granuloma within the left mid lung zone. There is
focal lingular pulmonary infiltrate, likely infectious or
inflammatory in the acute setting. The lungs are otherwise clear. No
pneumothorax or pleural effusion. Pulmonary insufflation is normal
and symmetric. Cardiac size within normal limits. Pulmonary
vascularity is normal. No acute bone abnormality.
IMPRESSION: Focal lingular pulmonary infiltrate, likely infectious or
inflammatory in the acute setting.

## 2023-12-06 ENCOUNTER — Ambulatory Visit

## 2023-12-17 DIAGNOSIS — H578A1 Foreign body sensation, right eye: Secondary | ICD-10-CM | POA: Diagnosis not present

## 2024-01-11 DIAGNOSIS — R5383 Other fatigue: Secondary | ICD-10-CM | POA: Diagnosis not present

## 2024-01-11 DIAGNOSIS — R002 Palpitations: Secondary | ICD-10-CM | POA: Diagnosis not present

## 2024-01-11 DIAGNOSIS — Z0189 Encounter for other specified special examinations: Secondary | ICD-10-CM | POA: Diagnosis not present

## 2024-01-11 DIAGNOSIS — I499 Cardiac arrhythmia, unspecified: Secondary | ICD-10-CM | POA: Diagnosis not present

## 2024-01-11 DIAGNOSIS — K219 Gastro-esophageal reflux disease without esophagitis: Secondary | ICD-10-CM | POA: Diagnosis not present

## 2024-01-14 DIAGNOSIS — I10 Essential (primary) hypertension: Secondary | ICD-10-CM | POA: Diagnosis not present

## 2024-01-14 DIAGNOSIS — R002 Palpitations: Secondary | ICD-10-CM | POA: Diagnosis not present

## 2024-01-14 DIAGNOSIS — Z0000 Encounter for general adult medical examination without abnormal findings: Secondary | ICD-10-CM | POA: Diagnosis not present

## 2024-01-14 DIAGNOSIS — Z1331 Encounter for screening for depression: Secondary | ICD-10-CM | POA: Diagnosis not present

## 2024-01-21 DIAGNOSIS — R002 Palpitations: Secondary | ICD-10-CM | POA: Diagnosis not present

## 2024-01-26 DIAGNOSIS — R002 Palpitations: Secondary | ICD-10-CM | POA: Diagnosis not present

## 2024-02-01 DIAGNOSIS — R002 Palpitations: Secondary | ICD-10-CM | POA: Diagnosis not present

## 2024-02-01 DIAGNOSIS — I38 Endocarditis, valve unspecified: Secondary | ICD-10-CM | POA: Diagnosis not present

## 2024-02-01 DIAGNOSIS — I10 Essential (primary) hypertension: Secondary | ICD-10-CM | POA: Diagnosis not present

## 2024-02-10 DIAGNOSIS — Z1231 Encounter for screening mammogram for malignant neoplasm of breast: Secondary | ICD-10-CM | POA: Diagnosis not present

## 2024-02-10 DIAGNOSIS — Z1331 Encounter for screening for depression: Secondary | ICD-10-CM | POA: Diagnosis not present

## 2024-02-10 DIAGNOSIS — Z01419 Encounter for gynecological examination (general) (routine) without abnormal findings: Secondary | ICD-10-CM | POA: Diagnosis not present

## 2024-02-18 DIAGNOSIS — R002 Palpitations: Secondary | ICD-10-CM | POA: Diagnosis not present

## 2024-02-18 DIAGNOSIS — I340 Nonrheumatic mitral (valve) insufficiency: Secondary | ICD-10-CM | POA: Diagnosis not present

## 2024-02-18 DIAGNOSIS — I10 Essential (primary) hypertension: Secondary | ICD-10-CM | POA: Diagnosis not present

## 2024-03-30 DIAGNOSIS — E66812 Obesity, class 2: Secondary | ICD-10-CM | POA: Diagnosis not present

## 2024-03-30 DIAGNOSIS — Z79899 Other long term (current) drug therapy: Secondary | ICD-10-CM | POA: Diagnosis not present

## 2024-03-30 DIAGNOSIS — I10 Essential (primary) hypertension: Secondary | ICD-10-CM | POA: Diagnosis not present

## 2024-03-30 DIAGNOSIS — E7800 Pure hypercholesterolemia, unspecified: Secondary | ICD-10-CM | POA: Diagnosis not present

## 2024-03-30 DIAGNOSIS — E559 Vitamin D deficiency, unspecified: Secondary | ICD-10-CM | POA: Diagnosis not present

## 2024-04-28 DIAGNOSIS — I10 Essential (primary) hypertension: Secondary | ICD-10-CM | POA: Diagnosis not present

## 2024-04-28 DIAGNOSIS — J309 Allergic rhinitis, unspecified: Secondary | ICD-10-CM | POA: Diagnosis not present

## 2024-05-11 DIAGNOSIS — I10 Essential (primary) hypertension: Secondary | ICD-10-CM | POA: Diagnosis not present

## 2024-06-01 DIAGNOSIS — G4733 Obstructive sleep apnea (adult) (pediatric): Secondary | ICD-10-CM | POA: Diagnosis not present

## 2024-06-01 DIAGNOSIS — E7800 Pure hypercholesterolemia, unspecified: Secondary | ICD-10-CM | POA: Diagnosis not present

## 2024-06-01 DIAGNOSIS — E66812 Obesity, class 2: Secondary | ICD-10-CM | POA: Diagnosis not present

## 2024-06-01 DIAGNOSIS — I10 Essential (primary) hypertension: Secondary | ICD-10-CM | POA: Diagnosis not present
